# Patient Record
Sex: Female | Born: 1990 | Hispanic: Yes
Health system: Midwestern US, Community
[De-identification: ages and names within clinical notes are randomized; demographics above are authoritative.]

## PROBLEM LIST (undated history)

## (undated) ENCOUNTER — Inpatient Hospital Stay (HOSPITAL_COMMUNITY): Payer: Self-pay

## (undated) DIAGNOSIS — E559 Vitamin D deficiency, unspecified: Secondary | ICD-10-CM

## (undated) DIAGNOSIS — I10 Essential (primary) hypertension: Secondary | ICD-10-CM

---

## 2017-06-28 ENCOUNTER — Encounter (HOSPITAL_COMMUNITY): Payer: Self-pay | Admitting: *Deleted

## 2017-06-28 ENCOUNTER — Inpatient Hospital Stay (HOSPITAL_COMMUNITY)
Admission: AD | Admit: 2017-06-28 | Discharge: 2017-06-28 | Disposition: A | Discharge status: Home / Self Care | Payer: Self-pay | Source: Ambulatory Visit | Attending: Obstetrics & Gynecology | Admitting: Obstetrics & Gynecology

## 2017-06-28 ENCOUNTER — Other Ambulatory Visit: Payer: Self-pay

## 2017-06-28 DIAGNOSIS — O26892 Other specified pregnancy related conditions, second trimester: Secondary | ICD-10-CM

## 2017-06-28 DIAGNOSIS — O0932 Supervision of pregnancy with insufficient antenatal care, second trimester: Secondary | ICD-10-CM

## 2017-06-28 DIAGNOSIS — Z3A24 24 weeks gestation of pregnancy: Secondary | ICD-10-CM

## 2017-06-28 DIAGNOSIS — R102 Pelvic and perineal pain: Secondary | ICD-10-CM | POA: Insufficient documentation

## 2017-06-28 DIAGNOSIS — O0990 Supervision of high risk pregnancy, unspecified, unspecified trimester: Secondary | ICD-10-CM

## 2017-06-28 DIAGNOSIS — O34219 Maternal care for unspecified type scar from previous cesarean delivery: Secondary | ICD-10-CM

## 2017-06-28 DIAGNOSIS — O09292 Supervision of pregnancy with other poor reproductive or obstetric history, second trimester: Secondary | ICD-10-CM

## 2017-06-28 DIAGNOSIS — Z363 Encounter for antenatal screening for malformations: Secondary | ICD-10-CM

## 2017-06-28 LAB — URINALYSIS, ROUTINE W REFLEX MICROSCOPIC
BILIRUBIN URINE: NEGATIVE
Glucose, UA: NEGATIVE mg/dL
HGB URINE DIPSTICK: NEGATIVE
KETONES UR: NEGATIVE mg/dL
Leukocytes, UA: NEGATIVE
Nitrite: NEGATIVE
PROTEIN: NEGATIVE mg/dL
Specific Gravity, Urine: 1.005 (ref 1.005–1.030)
pH: 7 (ref 5.0–8.0)

## 2017-06-28 LAB — CBC
HEMATOCRIT: 29 % — AB (ref 36.0–46.0)
Hemoglobin: 10.1 g/dL — ABNORMAL LOW (ref 12.0–15.0)
MCH: 31.2 pg (ref 26.0–34.0)
MCHC: 34.8 g/dL (ref 30.0–36.0)
MCV: 89.5 fL (ref 78.0–100.0)
PLATELETS: 260 10*3/uL (ref 150–400)
RBC: 3.24 MIL/uL — AB (ref 3.87–5.11)
RDW: 13 % (ref 11.5–15.5)
WBC: 8.6 10*3/uL (ref 4.0–10.5)

## 2017-06-28 LAB — DIFFERENTIAL
BASOS ABS: 0 10*3/uL (ref 0.0–0.1)
BASOS PCT: 0 %
Eosinophils Absolute: 0.1 10*3/uL (ref 0.0–0.7)
Eosinophils Relative: 1 %
Lymphocytes Relative: 19 %
Lymphs Abs: 1.6 10*3/uL (ref 0.7–4.0)
Monocytes Absolute: 0.4 10*3/uL (ref 0.1–1.0)
Monocytes Relative: 5 %
NEUTROS ABS: 6.4 10*3/uL (ref 1.7–7.7)
Neutrophils Relative %: 75 %

## 2017-06-28 LAB — TYPE AND SCREEN
ABO/RH(D): O POS
Antibody Screen: NEGATIVE

## 2017-06-28 LAB — WET PREP, GENITAL
CLUE CELLS WET PREP: NONE SEEN
Sperm: NONE SEEN
Trich, Wet Prep: NONE SEEN
Yeast Wet Prep HPF POC: NONE SEEN

## 2017-06-28 LAB — ABO/RH: ABO/RH(D): O POS

## 2017-06-28 LAB — HEPATITIS B SURFACE ANTIGEN: Hepatitis B Surface Ag: NEGATIVE

## 2017-06-28 NOTE — MAU Note (Signed)
Urine sent to lab 

## 2017-06-28 NOTE — MAU Provider Note (Signed)
CC:  Chief Complaint  Patient presents with  . no prenatal care     First Provider Initiated Contact with Patient 06/28/17 1722      HPI: Abigail Salazar is a 27 y.o. year old 952P1001 female at 8752w0d weeks gestation by LMP who presents to MAU reporting pelvic pressure. [place]oved [place]. States she a an early [place] in [place] because she conceived on birth control and they told her that she had an increased risk of preterm birth has a result. She thinks her EDD by US was around September 5th or 10th. Doesn't remember date or gestational age at US. Doesn't have records or access to records.    Plans to stay in [place] for delivery, but [facility]  Associated Sx:  Vaginal bleeding: Denies Leaking of fluid: Denies Fetal movement: Nml  O:  Patient Vitals for the past 24 hrs:  BP Temp src Pulse Resp SpO2 Weight  06/28/17 1558 (!) 108/59 Oral 86 17 100 % 132 lb 12.8 oz (60.2 kg)    General: NAD Heart: Regular rate Lungs: Normal rate and effort Abd: Soft, NT, Gravid, 27 cm fundal ht.  Pelvic: NEFG, Neg pooling, Neg  blood.  Dilation: Closed Exam by:: Dorathy KinsmanVirginia Reet Scharrer, CNM  EFM: 145, Moderate variability, 15 x 15 accelerations, no decelerations Toco: None  Orders Placed This Encounter  Procedures  . Wet prep, genital  . US MFM OB DETAIL +14 WK  . Hepatitis B surface antigen  . CBC  . Differential  . Urinalysis, Routine w reflex microscopic  . Type and [facility]  . ABO/Rh  . Discharge patient     A: 552w0d week IUP Pelvic pressure liekly Nml for GA. No evidence of preterm labor or other emergent condition.  FHR reactive 1. Pelvic pain affecting pregnancy in second trimester, antepartum   2. Limited prenatal care in second trimester  3. [redacted] weeks gestation of pregnancy   4. History of oligohydramnios in prior pregnancy, currently pregnant in second trimester   5. History of cesarean delivery, currently pregnant   6.  Encounter for antenatal screening for malformation using ultrasound      P: Discharge home in stable condition. Preterm labor precautions and fetal kick counts. Message sent to [facility] to scheduled NOB appt ASAP Anatomy US ordered OP OB Panel pending Return to maternity admissions as needed if symptoms worsen.  Katrinka BlazingSmith, [place], CNM 06/28/2017 5:22 PM  3

## 2017-06-28 NOTE — MAU Note (Signed)
Because of pregnancy, 2 wks ago started having pain and pushing down.  Happens once a wk. No pain today, just feels heavy. No bleeding or leaking, +fm. No care.  [place]rived in [place] 5[place]13, from [place], no care there either.  2nd preg- c/s ? Oligo  baby in NICU for 5 days

## 2017-06-28 NOTE — Discharge Instructions (Signed)
Dolor abdominal en el embarazo °(Abdominal Pain During Pregnancy) °El dolor abdominal es frecuente durante el embarazo. Generalmente no causa ningún daño. El dolor abdominal puede tener numerosas causas. Algunas causas son más graves que otras. Ciertas causas de dolor abdominal durante el embarazo se diagnostican fácilmente. A veces, se tarda un tiempo para llegar al diagnóstico. Otras veces la causa no se conoce. El dolor abdominal puede estar relacionado con alguna alteración del embarazo, o puede deberse a una causa totalmente diferente. Por este motivo, siempre consulte a su médico cuando sienta molestias abdominales. °INSTRUCCIONES PARA EL CUIDADO EN EL HOGAR  °Esté atenta al dolor para ver si hay cambios. Las siguientes indicaciones ayudarán a aliviar cualquier molestia que pueda sentir: °· No tenga relaciones sexuales y no coloque nada dentro de la vagina hasta que los síntomas hayan desaparecido completamente. °· Descanse todo lo que pueda hasta que el dolor se le haya calmado. °· Si siente náuseas, beba líquidos claros. Evite los alimentos sólidos mientras sienta malestar o tenga náuseas. °· Tome sólo medicamentos de venta libre o recetados, según las indicaciones del médico. °· Cumpla con todas las visitas de control, según le indique su médico. °SOLICITE ATENCIÓN MÉDICA DE INMEDIATO SI: °· Tiene un sangrado, pérdida de líquidos o elimina tejidos por la vagina. °· El dolor o los cólicos aumentan. °· Tiene vómitos persistentes. °· Comienza a sentir dolor al orinar u observa sangre. °· Tiene fiebre. °· Nota que los movimientos del bebé disminuyen. °· Siente intensa debilidad o se marea. °· Tiene dificultad para respirar con o sin dolor abdominal. °· Siente un dolor de cabeza intenso junto al dolor abdominal. °· Tiene una secreción vaginal anormal con dolor abdominal. °· Tiene diarrea persistente. °· El dolor abdominal sigue o empeora aún después de hacer reposo. °ASEGÚRESE DE QUE:  °· Comprende estas  instrucciones. °· Controlará su afección. °· Recibirá ayuda de inmediato si no mejora o si empeora. °Esta información no tiene como fin reemplazar el consejo del médico. Asegúrese de hacerle al médico cualquier pregunta que tenga. °Document Released: 01/11/2005 Document Revised: 05/05/2015 °Elsevier Interactive Patient Education © 2017 Elsevier Inc. ° °

## 2017-06-29 LAB — GC/CHLAMYDIA PROBE AMP (~~LOC~~) NOT AT ARMC
Chlamydia: NEGATIVE
NEISSERIA GONORRHEA: NEGATIVE

## 2017-06-29 LAB — HIV ANTIBODY (ROUTINE TESTING W REFLEX): HIV Screen 4th Generation wRfx: NONREACTIVE

## 2017-06-29 LAB — RPR: RPR: NONREACTIVE

## 2017-06-29 LAB — RUBELLA SCREEN: RUBELLA: 2.14 {index} (ref >0.99)

## 2017-06-30 DIAGNOSIS — O0932 Supervision of pregnancy with insufficient antenatal care, second trimester: Secondary | ICD-10-CM

## 2017-06-30 DIAGNOSIS — O34219 Maternal care for unspecified type scar from previous cesarean delivery: Secondary | ICD-10-CM | POA: Diagnosis present

## 2017-06-30 DIAGNOSIS — O0990 Supervision of high risk pregnancy, unspecified, unspecified trimester: Secondary | ICD-10-CM

## 2017-06-30 DIAGNOSIS — O09292 Supervision of pregnancy with other poor reproductive or obstetric history, second trimester: Secondary | ICD-10-CM

## 2017-07-05 ENCOUNTER — Encounter (HOSPITAL_COMMUNITY): Payer: Self-pay

## 2017-07-13 ENCOUNTER — Encounter (HOSPITAL_COMMUNITY): Payer: Self-pay

## 2017-07-13 ENCOUNTER — Ambulatory Visit (HOSPITAL_COMMUNITY)
Admission: RE | Admit: 2017-07-13 | Discharge: 2017-07-13 | Disposition: A | Discharge status: Home / Self Care | Payer: Self-pay | Source: Ambulatory Visit | Attending: Advanced Practice Midwife | Admitting: Advanced Practice Midwife

## 2017-07-13 ENCOUNTER — Other Ambulatory Visit (HOSPITAL_COMMUNITY): Payer: Self-pay | Admitting: Advanced Practice Midwife

## 2017-07-13 DIAGNOSIS — O26892 Other specified pregnancy related conditions, second trimester: Secondary | ICD-10-CM | POA: Insufficient documentation

## 2017-07-13 DIAGNOSIS — R102 Pelvic and perineal pain: Secondary | ICD-10-CM | POA: Insufficient documentation

## 2017-07-13 DIAGNOSIS — O0932 Supervision of pregnancy with insufficient antenatal care, second trimester: Secondary | ICD-10-CM | POA: Insufficient documentation

## 2017-07-13 DIAGNOSIS — Z363 Encounter for antenatal screening for malformations: Secondary | ICD-10-CM

## 2017-07-13 DIAGNOSIS — Z3A26 26 weeks gestation of pregnancy: Secondary | ICD-10-CM | POA: Insufficient documentation

## 2017-07-13 DIAGNOSIS — O09292 Supervision of pregnancy with other poor reproductive or obstetric history, second trimester: Secondary | ICD-10-CM | POA: Insufficient documentation

## 2017-07-13 DIAGNOSIS — O34219 Maternal care for unspecified type scar from previous cesarean delivery: Secondary | ICD-10-CM

## 2017-07-13 DIAGNOSIS — O0990 Supervision of high risk pregnancy, unspecified, unspecified trimester: Secondary | ICD-10-CM

## 2017-07-13 DIAGNOSIS — Z3A24 24 weeks gestation of pregnancy: Secondary | ICD-10-CM

## 2017-07-13 NOTE — ED Notes (Signed)
Eda Royal present as interpreter. 

## 2017-07-20 ENCOUNTER — Telehealth: Payer: Self-pay | Admitting: General Practice

## 2017-07-20 NOTE — Telephone Encounter (Signed)
Unable to reach patient via phone. Left message on VM for patient to give our office a call in regards to appointment on 07/29/17 at 1:35pm.  Appointment reminder mailed to patient.

## 2017-07-29 ENCOUNTER — Encounter: Payer: Self-pay | Admitting: General Practice

## 2017-07-29 ENCOUNTER — Ambulatory Visit (INDEPENDENT_AMBULATORY_CARE_PROVIDER_SITE_OTHER): Payer: Self-pay | Admitting: Obstetrics and Gynecology

## 2017-07-29 ENCOUNTER — Encounter: Payer: Self-pay | Admitting: Obstetrics and Gynecology

## 2017-07-29 VITALS — BP 99/57 | HR 81 | Wt 137.7 lb

## 2017-07-29 DIAGNOSIS — O0993 Supervision of high risk pregnancy, unspecified, third trimester: Secondary | ICD-10-CM

## 2017-07-29 DIAGNOSIS — Z124 Encounter for screening for malignant neoplasm of cervix: Secondary | ICD-10-CM

## 2017-07-29 DIAGNOSIS — Z98891 History of uterine scar from previous surgery: Secondary | ICD-10-CM

## 2017-07-29 DIAGNOSIS — O34212 Maternal care for vertical scar from previous cesarean delivery: Secondary | ICD-10-CM

## 2017-07-29 DIAGNOSIS — O0990 Supervision of high risk pregnancy, unspecified, unspecified trimester: Secondary | ICD-10-CM

## 2017-07-29 DIAGNOSIS — Z23 Encounter for immunization: Secondary | ICD-10-CM

## 2017-07-29 DIAGNOSIS — Z113 Encounter for screening for infections with a predominantly sexual mode of transmission: Secondary | ICD-10-CM

## 2017-07-29 LAB — POCT URINALYSIS DIP (DEVICE)
BILIRUBIN URINE: NEGATIVE
Glucose, UA: NEGATIVE mg/dL
HGB URINE DIPSTICK: NEGATIVE
KETONES UR: NEGATIVE mg/dL
Leukocytes, UA: NEGATIVE
NITRITE: NEGATIVE
PH: 7 (ref 5.0–8.0)
Protein, ur: NEGATIVE mg/dL
SPECIFIC GRAVITY, URINE: 1.015 (ref 1.005–1.030)
Urobilinogen, UA: 0.2 mg/dL (ref 0.0–1.0)

## 2017-07-29 NOTE — Progress Notes (Signed)
Video Interpreter # [id number] New ob/28 wk packet given

## 2017-07-29 NOTE — Progress Notes (Signed)
Patient do not have ID nor passport.  Stated they are working on getting that information.

## 2017-07-29 NOTE — Progress Notes (Signed)
Subjective:   Abigail Salazar is a 27 y.o. G2P1001 at 5783w3d by LMP being seen today for her first obstetrical visit.  Her obstetrical history is significant for Late to care, previous [place], [facility], vertical incision.  Patient does intend to breast feed. Pregnancy history fully reviewed. Happy about pregnancy.   Patient reports no complaints.  HISTORY: OB History  Gravida Para Term Preterm AB Living  2 1 1  0 0 1  SAB TAB Ectopic Multiple Live Births  0 0 0 0 1    # Outcome Date GA Lbr Len/2nd Weight Sex Delivery Anes PTL Lv  2 Current           1 Term 09/30/13 4380w0d  7 lb 5 oz (3.317 kg) F CS-Unspec Spinal N LIV     Complications: Oligohydramnios    Last pap smear was done 07/29/2017  Past Medical History:  Diagnosis Date  . Medical history non-contributory    Past Surgical History:  Procedure Laterality Date  . CESAREAN SECTION     Family History  Problem Relation Age of Onset  . Arthritis Father    Social History   Tobacco Use  . Smoking status: Never Smoker  . Smokeless tobacco: Never Used  Substance Use Topics  . Alcohol use: Not Currently  . Drug use: Not Currently   No Known Allergies Current Outpatient Medications on File Prior to Visit  Medication Sig Dispense Refill  . Prenatal Vit-Fe Fumarate-FA (PRENATAL MULTIVITAMIN) TABS tablet Take 1 tablet by mouth daily at 12 noon.     No current facility-administered medications on file prior to visit.     Review of Systems Pertinent items noted in HPI and remainder of comprehensive ROS otherwise negative.  Exam   Vitals:   07/29/17 1403  BP: (!) 99/57  Pulse: 81  Weight: 137 lb 11.2 oz (62.5 kg)   Fetal Heart Rate (bpm): 150  BP (!) 99/57   Pulse 81   Wt 137 lb 11.2 oz (62.5 kg)   LMP 01/11/2017 (Exact Date)   BMI 26.89 kg/m    Uterine Size: size equals dates  Pelvic Exam:    Perineum: No Hemorrhoids, Normal Perineum   Vulva: normal     Vagina:  normal mucosa, normal discharge, no palpable nodules   pH: Not done   Cervix: no bleeding following Pap, no cervical motion tenderness and no lesions   Adnexa: normal adnexa and no mass, fullness, tenderness   Bony Pelvis: Adequate  System: Breast:  No nipple retraction or dimpling, No nipple discharge or bleeding, No axillary or supraclavicular adenopathy, Normal to palpation without dominant masses   Skin: normal coloration and turgor, no rashes    Neurologic: negative   Extremities: normal strength, tone, and muscle mass   HEENT neck supple with midline trachea and thyroid without masses   Mouth/Teeth mucous membranes moist, pharynx normal without lesions   Neck supple and no masses   Cardiovascular: regular rate and rhythm, no murmurs or gallops   Respiratory:  appears well, vitals normal, no respiratory distress, acyanotic, normal RR, neck free of mass or lymphadenopathy, chest clear, no wheezing, crepitations, rhonchi, normal symmetric air entry   Abdomen: soft, non-tender; bowel sounds normal; no masses,  no organomegaly, vertical incision noted.   Urinary: urethral meatus normal     Assessment:   Pregnancy: G2P1001 Patient Active Problem List   Diagnosis Date Noted  . History of classical cesarean section 07/29/2017  .  Supervision of high risk pregnancy, antepartum 06/30/2017  . Limited prenatal care in second trimester 06/30/2017  . History of oligohydramnios in prior pregnancy, currently pregnant in second trimester 06/30/2017  . History of cesarean delivery, currently pregnant 06/30/2017     Plan:   1. Supervision of high risk pregnancy, antepartum - Cystic fibrosis gene test - Hemoglobinopathy Evaluation - SMN1 COPY NUMBER ANALYSIS (SMA Carrier Screen) - Tdap vaccine greater than or equal to 7yo IM - Cytology - PAP - 2 hour GTT at next visit; come fasting   2. History of classical cesarean section  Discussed with Dr. Earlene Plater, Dr. Earlene Plater will speak to  patient more about this at her next visit.   Other orders - POCT urinalysis dip (device)  Initial labs drawn. Continue prenatal vitamins. Genetic Screening discussed, Declined : results reviewed. Ultrasound discussed; fetal anatomic survey: results reviewed. Problem list reviewed and updated. The nature of [facility] with multiple MDs and other Advanced Practice Providers was explained to patient; also emphasized that residents, students are part of our team. Routine obstetric precautions reviewed. Return in about 2 weeks (around 08/12/2017) for Come fasting for 2 hour GTT .    Rasch, Harolyn Rutherford, NP Center for [employer], [facility]

## 2017-08-01 LAB — CYTOLOGY - PAP
Chlamydia: NEGATIVE
Diagnosis: NEGATIVE
Neisseria Gonorrhea: NEGATIVE

## 2017-08-05 ENCOUNTER — Other Ambulatory Visit: Payer: Self-pay | Admitting: General Practice

## 2017-08-05 DIAGNOSIS — O0990 Supervision of high risk pregnancy, unspecified, unspecified trimester: Secondary | ICD-10-CM

## 2017-08-05 LAB — CYSTIC FIBROSIS GENE TEST

## 2017-08-05 LAB — HEMOGLOBINOPATHY EVALUATION
Ferritin: 19 ng/mL (ref 15–150)
HEMATOCRIT: 33.8 % — AB (ref 34.0–46.6)
HEMOGLOBIN: 11.4 g/dL (ref 11.1–15.9)
HGB A: 98 % (ref 96.4–98.8)
HGB C: 0 %
HGB F QUANT: 0 % (ref 0.0–2.0)
HGB VARIANT: 0 %
Hgb A2 Quant: 2 % (ref 1.8–3.2)
Hgb S: 0 %
Hgb Solubility: NEGATIVE
MCH: 31 pg (ref 26.6–33.0)
MCHC: 33.7 g/dL (ref 31.5–35.7)
MCV: 92 fL (ref 79–97)
Platelets: 219 10*3/uL (ref 150–450)
RBC: 3.68 x10E6/uL — ABNORMAL LOW (ref 3.77–5.28)
RDW: 12.8 % (ref 12.3–15.4)
WBC: 8.3 10*3/uL (ref 3.4–10.8)

## 2017-08-05 LAB — SMN1 COPY NUMBER ANALYSIS (SMA CARRIER SCREENING)

## 2017-08-08 ENCOUNTER — Other Ambulatory Visit: Payer: Self-pay

## 2017-08-08 DIAGNOSIS — O0990 Supervision of high risk pregnancy, unspecified, unspecified trimester: Secondary | ICD-10-CM

## 2017-08-09 LAB — GLUCOSE TOLERANCE, 2 HOURS W/ 1HR
Glucose, 1 hour: 103 mg/dL (ref 65–179)
Glucose, 2 hour: 87 mg/dL (ref 65–152)
Glucose, Fasting: 71 mg/dL (ref 65–91)

## 2017-08-12 ENCOUNTER — Encounter: Payer: Self-pay | Admitting: Obstetrics and Gynecology

## 2017-08-12 ENCOUNTER — Ambulatory Visit (INDEPENDENT_AMBULATORY_CARE_PROVIDER_SITE_OTHER): Payer: Self-pay | Admitting: Obstetrics and Gynecology

## 2017-08-12 VITALS — BP 100/58 | HR 79 | Wt 138.0 lb

## 2017-08-12 DIAGNOSIS — O0932 Supervision of pregnancy with insufficient antenatal care, second trimester: Secondary | ICD-10-CM

## 2017-08-12 DIAGNOSIS — Z98891 History of uterine scar from previous surgery: Secondary | ICD-10-CM

## 2017-08-12 DIAGNOSIS — Z789 Other specified health status: Secondary | ICD-10-CM

## 2017-08-12 DIAGNOSIS — Z603 Acculturation difficulty: Secondary | ICD-10-CM | POA: Insufficient documentation

## 2017-08-12 DIAGNOSIS — O09292 Supervision of pregnancy with other poor reproductive or obstetric history, second trimester: Secondary | ICD-10-CM

## 2017-08-12 DIAGNOSIS — O0990 Supervision of high risk pregnancy, unspecified, unspecified trimester: Secondary | ICD-10-CM

## 2017-08-12 NOTE — Patient Instructions (Signed)
AREA PEDIATRIC/FAMILY PRACTICE PHYSICIANS  [facility] [address], [address] [place], [place]  [address] Phone - [phone]   Fax - [phone]  [facility] [address] [place], [place] [address] Phone - [phone]   Fax - [phone]  [facility] [address] [place], [place]  [address] Phone - [phone]   Fax - [phone]  [facility] [address] [place], [place]  [address] Phone - [phone]   Fax - [phone]  [facility] [address] [place], [place]  [address] Phone - [phone]   Fax - [phone]  [facility] [address] [place], [place]  [address] Phone - [phone]   Fax - [phone]  [facility] [address] [place], [place]  [address] Phone - [phone]   Fax - [phone]  [facility] [address] [place], [place]  [address] Phone - [phone]   Fax - [phone]  [facility] [address] [place], [place]  [address] Phone - [phone]   Fax - [phone] [facility] [address] [place], [place]  [address] Phone - [phone]   Fax - [phone]  [facility] [address] [place], [place]  [address] Phone - [phone]   Fax - [phone]  [facility] [address] [place], [place]  [address] Phone - [phone]   Fax - [phone]  [facility] [address] [place], [place]  [address] Phone - [phone]   Fax - [phone]  [facility] [address] [place], [place]  [address] Phone - [phone]  [facility] [address] [place], [place]  [address] Phone - [phone]   Fax - [phone]  [facility] [address] [place], [place]  [address] Phone - [phone]   Fax - [phone]  [facility] [address] [place], [place]  [address] Phone - [phone]   Fax - [phone]  [facility] [address] [place], [place]  [address] Phone - [phone]   Fax - [phone]   [facility] [address] [place], [place]  [address] Phone - [phone]   Fax - [phone]  [facility] [address] [place], [place]  [address] Phone - [phone]   Fax - [phone]  [facility] [address] [place], [place]  [address] Phone - [phone]   Fax - [phone]  [facility] [address] [place], [place]  [address] Phone - [phone]   Fax - [phone]  [facility] [address] , [place]  [address] Phone - [phone]   Fax - [phone] [facility] [address] [place], [place]  [address] Phone - [phone]   Fax - [phone]  [facility] [address] [place],   [address] Phone - [phone]   Fax - [phone]  [facility] [address] [place],   [address] Phone - [phone]   Fax - [phone]  [facility] Charlene Flemming MD [address] [place]  [address] Phone [phone]  Fax [phone]   

## 2017-08-12 NOTE — Progress Notes (Signed)
   PRENATAL VISIT NOTE  Subjective:  Abigail Salazar is a 27 y.o. G2P1001 at 7177w3d being seen today for ongoing prenatal care.  She is currently monitored for the following issues for this low-risk pregnancy and has Supervision of high risk pregnancy, antepartum; Limited prenatal care in second trimester; History of oligohydramnios in prior pregnancy, currently pregnant in second trimester; History of cesarean delivery, currently pregnant; History of classical cesarean section; and Language barrier on their problem list.  Patient reports no complaints.  Contractions: Not present. Vag. Bleeding: None.  Movement: Present. Denies leaking of fluid.   The following portions of the patient's history were reviewed and updated as appropriate: allergies, current medications, past family history, past medical history, past social history, past surgical history and problem list. Problem list updated.  Objective:   Vitals:   08/12/17 0952  BP: (!) 100/58  Pulse: 79  Weight: 138 lb (62.6 kg)    Fetal Status: Fetal Heart Rate (bpm): 144   Movement: Present     General:  Alert, oriented and cooperative. Patient is in no acute distress.  Skin: Skin is warm and dry. No rash noted.   Cardiovascular: Normal heart rate noted  Respiratory: Normal respiratory effort, no problems with respiration noted  Abdomen: Soft, gravid, appropriate for gestational age.  Pain/Pressure: Present     Pelvic: Cervical exam deferred        Extremities: Normal range of motion.  Edema: None  Mental Status: Normal mood and affect. Normal behavior. Normal judgment and thought content.   Assessment and Plan:  Pregnancy: G2P1001 at 8377w3d  1. Supervision of high risk pregnancy, antepartum  2. Limited prenatal care in second trimester  3. History of oligohydramnios in prior pregnancy, currently pregnant in second trimester  4. History of vertical skin incision in [place] Patient reports emergency c-section done at  40 weeks for no fluid in baby. Did not labor. Was not told she should have a repeat c-section, reports baby was in correct position (head down). I reviewed the possibility of classical vs low transverse hysterotomy with her and the risks of laboring with an unknown scar (she has a vertical skin incision). As she had a c-section at 40 weeks while not laboring, it would be reasonable to assume she had a low transverse hysterotomy and allow her to labor, but reviewed that we could not know for certain. She verbalizes understanding of the above. Reviewed risks/benefits of TOLAC versus RCS in detail. Patient counseled regarding potential vaginal delivery, chance of success, future implications, possible uterine rupture and need for urgent/emergent repeat cesarean. Counseled regarding potential need for repeat c-section for reasons unrelated to first c-section. Counseled regarding scheduled repeat cesarean including risks of bleeding, infection, damage to surrounding tissue, abnormal placentation, implications for future pregnancies. All questions answered.  Patient leaning towards repeat c-section, will consider.  5. Language barrier Spanish interpretor used   Preterm labor symptoms and general obstetric precautions including but not limited to vaginal bleeding, contractions, leaking of fluid and fetal movement were reviewed in detail with the patient. Please refer to After Visit Summary for other counseling recommendations.  Return in about 2 weeks (around 08/26/2017) for OB visit.  Future Appointments  Date Time Provider Department Center  08/26/2017 11:15 AM Constant, Gigi GinPeggy, MD [facility]    Conan BowensKelly M Faraaz Wolin, MD

## 2017-08-13 ENCOUNTER — Encounter (HOSPITAL_COMMUNITY): Payer: Self-pay

## 2017-08-13 ENCOUNTER — Other Ambulatory Visit: Payer: Self-pay

## 2017-08-13 ENCOUNTER — Inpatient Hospital Stay (HOSPITAL_COMMUNITY)
Admission: AD | Admit: 2017-08-13 | Discharge: 2017-08-13 | Disposition: A | Discharge status: Home / Self Care | Payer: Self-pay | Source: Ambulatory Visit | Attending: Obstetrics and Gynecology | Admitting: Obstetrics and Gynecology

## 2017-08-13 DIAGNOSIS — Z3A30 30 weeks gestation of pregnancy: Secondary | ICD-10-CM | POA: Insufficient documentation

## 2017-08-13 DIAGNOSIS — Z3A35 35 weeks gestation of pregnancy: Secondary | ICD-10-CM

## 2017-08-13 DIAGNOSIS — O26893 Other specified pregnancy related conditions, third trimester: Secondary | ICD-10-CM | POA: Insufficient documentation

## 2017-08-13 DIAGNOSIS — R1030 Lower abdominal pain, unspecified: Secondary | ICD-10-CM | POA: Insufficient documentation

## 2017-08-13 DIAGNOSIS — O4703 False labor before 37 completed weeks of gestation, third trimester: Secondary | ICD-10-CM

## 2017-08-13 DIAGNOSIS — R109 Unspecified abdominal pain: Secondary | ICD-10-CM

## 2017-08-13 HISTORY — DX: Essential (primary) hypertension: I10

## 2017-08-13 LAB — URINALYSIS, ROUTINE W REFLEX MICROSCOPIC
BILIRUBIN URINE: NEGATIVE
Glucose, UA: NEGATIVE mg/dL
HGB URINE DIPSTICK: NEGATIVE
KETONES UR: NEGATIVE mg/dL
Leukocytes, UA: NEGATIVE
Nitrite: NEGATIVE
PROTEIN: NEGATIVE mg/dL
Specific Gravity, Urine: 1.004 — ABNORMAL LOW (ref 1.005–1.030)
pH: 7 (ref 5.0–8.0)

## 2017-08-13 MED ORDER — TERBUTALINE SULFATE 1 MG/ML IJ SOLN
0.2500 mg | Freq: Once | INTRAMUSCULAR | Status: AC
  Administered 2017-08-13: 0.25 mg via SUBCUTANEOUS
  Filled 2017-08-13: qty 1

## 2017-08-13 NOTE — Discharge Instructions (Signed)
Dolor abdominal durante el embarazo (Abdominal Pain During Pregnancy) El dolor de vientre (abdominal) es habitual durante el embarazo. Generalmente no se trata de un problema grave. Otras veces puede ser un signo de que algo no anda bien. Siempre comunquese con su mdico si tiene dolor abdominal. CUIDADOS EN EL HOGAR Controle el dolor para ver si hay cambios. Las indicaciones que siguen pueden ayudarla a sentirse mejor:  Notenga sexo (relaciones sexuales) ni se coloque nada dentro de la vagina hasta que se sienta mejor.  Haga reposo hasta que el dolor se calme.  Si siente ganas de vomitar (nuseas ) beba lquidos claros. No consuma alimentos slidos hasta que se sienta mejor.  Slo tome los medicamentos que le haya indicado su mdico.  Cumpla con las visitas al mdico segn las indicaciones. SOLICITE AYUDA DE INMEDIATO SI:  Tiene un sangrado, pierde lquido o elimina trozos de tejido por la vagina.  Siente ms dolor o clicos.  Comienza a vomitar.  Siente dolor al orinar u observa sangre en la orina.  Tiene fiebre.  No siente que el beb se mueva mucho.  Se siente muy dbil o cree que va a desmayarse.  Tiene dificultad para respirar con o sin dolor en el vientre.  Siente un dolor de cabeza muy intenso y dolor en el vientre.  Observa que sale un lquido por la vagina y tiene dolor abdominal.  La materia fecal es lquida (diarrea).  El dolor en el viente no desaparece, o empeora, luego de hacer reposo. ASEGRESE DE QUE:  Comprende estas instrucciones.  Controlar su afeccin.  Recibir ayuda de inmediato si no mejora o si empeora. Esta informacin no tiene como fin reemplazar el consejo del mdico. Asegrese de hacerle al mdico cualquier pregunta que tenga. Document Released: 09/23/2010 Document Revised: 05/05/2015 Document Reviewed: 08/10/2012 Elsevier Interactive Patient Education  2018 Elsevier Inc.  

## 2017-08-13 NOTE — Progress Notes (Addendum)
G2P1 with previous c/s. Noted classical incision. Pt is unsure of internal incision.   Presents to triage for ctx. States no ctx now. Denies LOF or bleeding. +FM  EFM applied  1957: provider at bs  SVE closed   2020: big pitcher of water provided for pt to drink per provider.   2037: checked on pt. Pt cont to drink water and only has 1/4 cup left of the pitcher of water.   Urine results back. Provider made aware.   2046: ordered for terb noted.  2049 pending med verification by pharmacy.   2056: terb administered.   2100: tranducer adjusted. Turned pt to left side  2125: SVE closed

## 2017-08-13 NOTE — MAU Note (Signed)
Urine in lab 

## 2017-08-13 NOTE — MAU Provider Note (Addendum)
History   G2 P1-0-0-1 at 30 weeks and 4 days in with lower abdominal pain since this morning.  Patient denies vaginal bleeding or rupture membranes.  Patient did have classical cesarean section with prior pregnancy for unknown reason.  Patient admits to poor fluid intake today.  CSN: [id number]  Arrival date & time 08/13/17  1938   None     Chief Complaint  Patient presents with  . Contractions    HPI  Past Medical History:  Diagnosis Date  . Medical history non-contributory     Past Surgical History:  Procedure Laterality Date  . CESAREAN SECTION      Family History  Problem Relation Age of Onset  . Arthritis Father     Social History   Tobacco Use  . Smoking status: Never Smoker  . Smokeless tobacco: Never Used  Substance Use Topics  . Alcohol use: Not Currently  . Drug use: Not Currently    OB History    Gravida  2   Para  1   Term  1   Preterm      AB      Living  1     SAB      TAB      Ectopic      Multiple      Live Births  1           Review of Systems  Constitutional: Negative.   HENT: Negative.   Eyes: Negative.   Respiratory: Negative.   Cardiovascular: Negative.   Gastrointestinal: Positive for abdominal pain.  Endocrine: Negative.   Genitourinary: Negative.   Musculoskeletal: Negative.   Skin: Negative.   Allergic/Immunologic: Negative.   Neurological: Negative.   Hematological: Negative.   Psychiatric/Behavioral: Negative.     Allergies  Patient has no known allergies.  Home Medications    LMP 01/11/2017 (Exact Date)   Physical Exam  Constitutional: She is oriented to person, place, and time. She appears well-developed and well-nourished.  HENT:  Head: Normocephalic.  Neck: Normal range of motion.  Cardiovascular: Normal rate, regular rhythm, normal heart sounds and intact distal pulses.  Pulmonary/Chest: Effort normal and breath sounds normal.  Abdominal: Soft. Bowel sounds are normal.  Genitourinary:  Vagina normal and uterus normal.  Musculoskeletal: Normal range of motion.  Neurological: She is alert and oriented to person, place, and time.  Skin: Skin is warm and dry.  Psychiatric: She has a normal mood and affect. Her behavior is normal. Judgment and thought content normal.    MAU Course  Procedures (including critical care time)  Labs Reviewed - No data to display No results found.     Patient CARE turned over to Jearld AdjutantErin Digna Countess nurse practitioner.  MDM  Vital signs are stable fetal heart rate is 145-150 with accelerations no decelerations moderate variability.  Mild uc's noted q 2-3 min apart. Will hydrate pt.  Sterile vaginal exam was done cervix is closed posterior thick and high.  Contractions have spaced out with oral hydration, we will give 1 dose of subcu terbutaline and attempt to arrest uterine contractions.   Ctx resolved with terbutaline. Patients denies pain. Cervix remains closed.   A: 1. Abdominal pain in pregnancy, third trimester   2. Preterm uterine contractions in third trimester, antepartum   3. [redacted] weeks gestation of pregnancy    P: Discharge home PTL precautions F/u with OB  Judeth HornLawrence, Shawntae Lowy, NP

## 2017-08-26 ENCOUNTER — Ambulatory Visit (INDEPENDENT_AMBULATORY_CARE_PROVIDER_SITE_OTHER): Payer: Self-pay | Admitting: Obstetrics and Gynecology

## 2017-08-26 ENCOUNTER — Encounter: Payer: Self-pay | Admitting: Obstetrics and Gynecology

## 2017-08-26 VITALS — BP 96/62 | HR 75 | Wt 139.0 lb

## 2017-08-26 DIAGNOSIS — O0932 Supervision of pregnancy with insufficient antenatal care, second trimester: Secondary | ICD-10-CM

## 2017-08-26 DIAGNOSIS — O0993 Supervision of high risk pregnancy, unspecified, third trimester: Secondary | ICD-10-CM

## 2017-08-26 DIAGNOSIS — O09292 Supervision of pregnancy with other poor reproductive or obstetric history, second trimester: Secondary | ICD-10-CM

## 2017-08-26 DIAGNOSIS — O0933 Supervision of pregnancy with insufficient antenatal care, third trimester: Secondary | ICD-10-CM

## 2017-08-26 DIAGNOSIS — O0990 Supervision of high risk pregnancy, unspecified, unspecified trimester: Secondary | ICD-10-CM

## 2017-08-26 DIAGNOSIS — Z98891 History of uterine scar from previous surgery: Secondary | ICD-10-CM

## 2017-08-26 DIAGNOSIS — O09293 Supervision of pregnancy with other poor reproductive or obstetric history, third trimester: Secondary | ICD-10-CM

## 2017-08-26 MED ORDER — BUTALBITAL-APAP-CAFFEINE 50-325-40 MG PO CAPS: 1.0000 | ORAL_CAPSULE | Freq: Four times a day (QID) | ORAL | 3 refills | Status: DC | PRN

## 2017-08-26 NOTE — Progress Notes (Signed)
Patient reports headaches since last visit, denies dizziness or blurry vision

## 2017-08-26 NOTE — Progress Notes (Signed)
   PRENATAL VISIT NOTE  Subjective:  Abigail Salazar is a 4[name] y.o. G2P1001 at 348w3d being seen today for ongoing prenatal care.  She is currently monitored for the following issues for this low-risk pregnancy and has Supervision of high risk pregnancy, antepartum; Limited prenatal care in second trimester; History of oligohydramnios in prior pregnancy, currently pregnant in second trimester; History of cesarean delivery, currently pregnant; History of classical cesarean section; and Language barrier on their problem list.  Patient reports headache.  Contractions: Not present. Vag. Bleeding: None.  Movement: Present. Denies leaking of fluid.   The following portions of the patient's history were reviewed and updated as appropriate: allergies, current medications, past family history, past medical history, past social history, past surgical history and problem list. Problem list updated.  Objective:   Vitals:   08/26/17 1121  BP: 96/62  Pulse: 75  Weight: 139 lb (63 kg)    Fetal Status: Fetal Heart Rate (bpm): 143 Fundal Height: 32 cm Movement: Present     General:  Alert, oriented and cooperative. Patient is in no acute distress.  Skin: Skin is warm and dry. No rash noted.   Cardiovascular: Normal heart rate noted  Respiratory: Normal respiratory effort, no problems with respiration noted  Abdomen: Soft, gravid, appropriate for gestational age.  Pain/Pressure: Absent     Pelvic: Cervical exam deferred        Extremities: Normal range of motion.  Edema: None  Mental Status: Normal mood and affect. Normal behavior. Normal judgment and thought content.   Assessment and Plan:  Pregnancy: G2P1001 at 348w3d  1. Supervision of high risk pregnancy, antepartum Patient is doing well Advised patient to stay hydrated Offered Fioricet  2. Limited prenatal care in second trimester   4. History of classical cesarean section Vertical skin incision with delivery at 40 weeks Patient is  interested in repeat cesarean section- will be scheduled at 39 weeks  Preterm labor symptoms and general obstetric precautions including but not limited to vaginal bleeding, contractions, leaking of fluid and fetal movement were reviewed in detail with the patient. Please refer to After Visit Summary for other counseling recommendations.  Return in about 2 weeks (around 09/09/2017) for ROB.  No future appointments.  Catalina AntiguaPeggy Kashena Novitski, MD

## 2017-08-29 ENCOUNTER — Telehealth: Payer: Self-pay | Admitting: *Deleted

## 2017-08-29 ENCOUNTER — Encounter (HOSPITAL_COMMUNITY): Payer: Self-pay

## 2017-08-29 NOTE — Telephone Encounter (Signed)
Found printed prescription on printer instead of eprescribe. Called in rx for Fioricet #30 by Dr. Jolayne Pantheronstant on 08/26/17. Left message if already filled by eprescibe, disregard message for this rx.

## 2017-09-09 ENCOUNTER — Other Ambulatory Visit: Payer: Self-pay | Admitting: Family Medicine

## 2017-09-15 ENCOUNTER — Ambulatory Visit (INDEPENDENT_AMBULATORY_CARE_PROVIDER_SITE_OTHER): Payer: Self-pay | Admitting: Obstetrics and Gynecology

## 2017-09-15 ENCOUNTER — Encounter: Payer: Self-pay | Admitting: Obstetrics and Gynecology

## 2017-09-15 VITALS — BP 96/59 | HR 67 | Wt 140.0 lb

## 2017-09-15 DIAGNOSIS — Z789 Other specified health status: Secondary | ICD-10-CM

## 2017-09-15 DIAGNOSIS — O34219 Maternal care for unspecified type scar from previous cesarean delivery: Secondary | ICD-10-CM

## 2017-09-15 DIAGNOSIS — O0990 Supervision of high risk pregnancy, unspecified, unspecified trimester: Secondary | ICD-10-CM

## 2017-09-15 NOTE — Progress Notes (Signed)
   PRENATAL VISIT NOTE  Subjective:  Abigail Salazar is a 27 y.o. G2P1001 at 78102w2d being seen today for ongoing prenatal care.  She is currently monitored for the following issues for this low-risk pregnancy and has Supervision of high risk pregnancy, antepartum; Limited prenatal care in second trimester; History of oligohydramnios in prior pregnancy, currently pregnant in second trimester; History of cesarean delivery, currently pregnant; and Language barrier on their problem list.  Patient reports no complaints.  Contractions: Irregular. Vag. Bleeding: None.  Movement: Present. Denies leaking of fluid.   The following portions of the patient's history were reviewed and updated as appropriate: allergies, current medications, past family history, past medical history, past social history, past surgical history and problem list. Problem list updated.  Objective:   Vitals:   09/15/17 1102  BP: (!) 96/59  Pulse: 67  Weight: 140 lb (63.5 kg)    Fetal Status: Fetal Heart Rate (bpm): 170 Fundal Height: 35 cm Movement: Present     General:  Alert, oriented and cooperative. Patient is in no acute distress.  Skin: Skin is warm and dry. No rash noted.   Cardiovascular: Normal heart rate noted  Respiratory: Normal respiratory effort, no problems with respiration noted  Abdomen: Soft, gravid, appropriate for gestational age.  Pain/Pressure: Present     Pelvic: Cervical exam deferred        Extremities: Normal range of motion.  Edema: None  Mental Status: Normal mood and affect. Normal behavior. Normal judgment and thought content.   Assessment and Plan:  Pregnancy: G2P1001 at 82102w2d  1. Supervision of high risk pregnancy, antepartum Patient is doing well without complaints Cultures next visit   2. History of cesarean delivery, currently pregnant Scheduled for repeat at 39 weeks  3. Language barrier Spanish interpreter used  Preterm labor symptoms and general obstetric  precautions including but not limited to vaginal bleeding, contractions, leaking of fluid and fetal movement were reviewed in detail with the patient. Please refer to After Visit Summary for other counseling recommendations.  Return in about 1 week (around 09/22/2017) for ROB.  No future appointments.  Catalina AntiguaPeggy Jaimy Kliethermes, MD

## 2017-09-28 ENCOUNTER — Ambulatory Visit (INDEPENDENT_AMBULATORY_CARE_PROVIDER_SITE_OTHER): Payer: Self-pay | Admitting: Obstetrics and Gynecology

## 2017-09-28 ENCOUNTER — Encounter: Payer: Self-pay | Admitting: Obstetrics and Gynecology

## 2017-09-28 VITALS — BP 100/61 | HR 85 | Wt 144.3 lb

## 2017-09-28 DIAGNOSIS — O34219 Maternal care for unspecified type scar from previous cesarean delivery: Secondary | ICD-10-CM

## 2017-09-28 DIAGNOSIS — Z603 Acculturation difficulty: Secondary | ICD-10-CM

## 2017-09-28 DIAGNOSIS — Z789 Other specified health status: Secondary | ICD-10-CM

## 2017-09-28 DIAGNOSIS — Z113 Encounter for screening for infections with a predominantly sexual mode of transmission: Secondary | ICD-10-CM

## 2017-09-28 DIAGNOSIS — O0990 Supervision of high risk pregnancy, unspecified, unspecified trimester: Secondary | ICD-10-CM

## 2017-09-28 NOTE — Progress Notes (Signed)
Offered Flu shot wants to think over

## 2017-09-28 NOTE — Progress Notes (Signed)
Subjective:  Abigail Salazar is a 27 y.o. G2P1001 at [redacted]w[redacted]d being seen today for ongoing prenatal care.  She is currently monitored for the following issues for this low-risk pregnancy and has Supervision of high risk pregnancy, antepartum; Limited prenatal care in second trimester; History of oligohydramnios in prior pregnancy, currently pregnant in second trimester; History of cesarean delivery, currently pregnant; and Language barrier on their problem list.  Patient reports general discomforts .  Contractions: Irritability. Vag. Bleeding: None.  Movement: Present. Denies leaking of fluid.   The following portions of the patient's history were reviewed and updated as appropriate: allergies, current medications, past family history, past medical history, past social history, past surgical history and problem list. Problem list updated.  Objective:   Vitals:   09/28/17 1526  BP: 100/61  Pulse: 85  Weight: 144 lb 4.8 oz (65.5 kg)    Fetal Status: Fetal Heart Rate (bpm): 136   Movement: Present     General:  Alert, oriented and cooperative. Patient is in no acute distress.  Skin: Skin is warm and dry. No rash noted.   Cardiovascular: Normal heart rate noted  Respiratory: Normal respiratory effort, no problems with respiration noted  Abdomen: Soft, gravid, appropriate for gestational age. Pain/Pressure: Present     Pelvic:  Cervical exam deferred        Extremities: Normal range of motion.  Edema: None  Mental Status: Normal mood and affect. Normal behavior. Normal judgment and thought content.   Urinalysis:      Assessment and Plan:  Pregnancy: G2P1001 at [redacted]w[redacted]d  1. Language barrier Interrupter used during today's visit  2. Supervision of high risk pregnancy, antepartum - GC/Chlamydia probe amp ([facility])not at [facility] - Culture, beta strep (group b only)  3. History of cesarean delivery, currently pregnant Repeat scheduled for 10/11/17  Preterm labor symptoms and general  obstetric precautions including but not limited to vaginal bleeding, contractions, leaking of fluid and fetal movement were reviewed in detail with the patient. Please refer to After Visit Summary for other counseling recommendations.  Return in about 1 week (around 10/05/2017) for OB visit.   Hermina Staggers, MD

## 2017-09-29 ENCOUNTER — Telehealth (HOSPITAL_COMMUNITY): Payer: Self-pay | Admitting: *Deleted

## 2017-09-29 LAB — GC/CHLAMYDIA PROBE AMP (~~LOC~~) NOT AT ARMC
CHLAMYDIA, DNA PROBE: NEGATIVE
NEISSERIA GONORRHEA: NEGATIVE

## 2017-09-29 NOTE — Telephone Encounter (Signed)
Preadmission screen  

## 2017-09-29 NOTE — Pre-Procedure Instructions (Signed)
Interpreter number [id number] 

## 2017-10-01 LAB — CULTURE, BETA STREP (GROUP B ONLY): STREP GP B CULTURE: POSITIVE — AB

## 2017-10-03 ENCOUNTER — Telehealth (HOSPITAL_COMMUNITY): Payer: Self-pay | Admitting: *Deleted

## 2017-10-03 NOTE — Telephone Encounter (Signed)
Preadmission screen  

## 2017-10-04 ENCOUNTER — Telehealth (HOSPITAL_COMMUNITY): Payer: Self-pay | Admitting: *Deleted

## 2017-10-04 NOTE — Telephone Encounter (Signed)
Preadmission screen  

## 2017-10-05 ENCOUNTER — Ambulatory Visit (INDEPENDENT_AMBULATORY_CARE_PROVIDER_SITE_OTHER): Payer: Self-pay | Admitting: Obstetrics and Gynecology

## 2017-10-05 ENCOUNTER — Ambulatory Visit: Payer: Self-pay

## 2017-10-05 VITALS — BP 105/69 | HR 93 | Wt 145.4 lb

## 2017-10-05 DIAGNOSIS — Z789 Other specified health status: Secondary | ICD-10-CM

## 2017-10-05 DIAGNOSIS — Z23 Encounter for immunization: Secondary | ICD-10-CM

## 2017-10-05 DIAGNOSIS — Z603 Acculturation difficulty: Secondary | ICD-10-CM

## 2017-10-05 DIAGNOSIS — O09293 Supervision of pregnancy with other poor reproductive or obstetric history, third trimester: Secondary | ICD-10-CM

## 2017-10-05 DIAGNOSIS — O0993 Supervision of high risk pregnancy, unspecified, third trimester: Secondary | ICD-10-CM

## 2017-10-05 DIAGNOSIS — O0990 Supervision of high risk pregnancy, unspecified, unspecified trimester: Secondary | ICD-10-CM

## 2017-10-05 DIAGNOSIS — O34219 Maternal care for unspecified type scar from previous cesarean delivery: Secondary | ICD-10-CM

## 2017-10-05 DIAGNOSIS — O99820 Streptococcus B carrier state complicating pregnancy: Secondary | ICD-10-CM

## 2017-10-05 NOTE — Progress Notes (Signed)
Interpreter Cicero Duck present for encounter.  Pt informed that the ultrasound is considered a limited OB ultrasound and is not intended to be a complete ultrasound exam.  Patient also informed that the ultrasound is not being completed with the intent of assessing for fetal or placental anomalies or any pelvic abnormalities.  Explained that the purpose of today's ultrasound is to assess for presentation and amniotic fluid volume.  Patient acknowledges the purpose of the exam and the limitations of the study.

## 2017-10-05 NOTE — Progress Notes (Signed)
Pt states this morning she felt that the baby moved a lot and that is causing her a lot of pain on her left & right side of her stomach. Did not take Tylenol.

## 2017-10-06 NOTE — Progress Notes (Signed)
Prenatal Visit Note Date: 10/05/2017 Clinic: [facility]  Subjective:  Abigail Salazar is a 27 y.o. G2P1001 at 3226w1d being seen today for ongoing prenatal care.  She is currently monitored for the following issues for this high-risk pregnancy and has Supervision of high risk pregnancy, antepartum; Limited prenatal care in second trimester; History of oligohydramnios in prior pregnancy, currently pregnant in second trimester; History of cesarean delivery, currently pregnant; Language barrier; and GBS (group B Streptococcus carrier), +RV culture, currently pregnant on their problem list.  Patient reports having some occasional abdominal pains, no VB, LOF.   Contractions: Irritability. Vag. Bleeding: None.  Movement: Present. Denies leaking of fluid.   The following portions of the patient's history were reviewed and updated as appropriate: allergies, current medications, past family history, past medical history, past social history, past surgical history and problem list. Problem list updated.  Objective:   Vitals:   10/05/17 1506  BP: 105/69  Pulse: 93  Weight: 145 lb 6.4 oz (66 kg)    Fetal Status: Fetal Heart Rate (bpm): 150 Fundal Height: 38 cm Movement: Present  Presentation: Vertex  General:  Alert, oriented and cooperative. Patient is in no acute distress.  Skin: Skin is warm and dry. No rash noted.   Cardiovascular: Normal heart rate noted  Respiratory: Normal respiratory effort, no problems with respiration noted  Abdomen: Soft, gravid, appropriate for gestational age. Pain/Pressure: Present     Pelvic:  Cervical exam deferred        Extremities: Normal range of motion.  Edema: None  Mental Status: Normal mood and affect. Normal behavior. Normal judgment and thought content.   Urinalysis:      Assessment and Plan:  Pregnancy: G2P1001 at 4226w1d  1. Supervision of high risk pregnancy, antepartum Routine care.  - Flu Vaccine QUAD 36+ mos  IM  2. Language barrier Interpreter used  3. GBS (group B Streptococcus carrier), +RV culture, currently pregnant  4. History of cesarean delivery, currently pregnant Rpt already scheduled  5. H/O oligohydramnios in prior pregnancy, currently pregnant, third trimester rNST and AFI normal today.  - Fetal nonstress test - US OB Limited; Future  Preterm labor symptoms and general obstetric precautions including but not limited to vaginal bleeding, contractions, leaking of fluid and fetal movement were reviewed in detail with the patient. Please refer to After Visit Summary for other counseling recommendations.  Return in about 3 weeks (around 10/26/2017) for incision check.   [place] BingPickens, [name], MD

## 2017-10-10 ENCOUNTER — Encounter (HOSPITAL_COMMUNITY): Payer: Self-pay

## 2017-10-10 ENCOUNTER — Encounter (HOSPITAL_COMMUNITY)
Admission: RE | Admit: 2017-10-10 | Discharge: 2017-10-10 | Disposition: A | Discharge status: Home / Self Care | Payer: Medicaid Other | Source: Ambulatory Visit | Attending: Family Medicine | Admitting: Family Medicine

## 2017-10-10 LAB — CBC
HCT: 33.6 % — ABNORMAL LOW (ref 36.0–46.0)
HEMOGLOBIN: 11.4 g/dL — AB (ref 12.0–15.0)
MCH: 30 pg (ref 26.0–34.0)
MCHC: 33.9 g/dL (ref 30.0–36.0)
MCV: 88.4 fL (ref 78.0–100.0)
Platelets: 166 10*3/uL (ref 150–400)
RBC: 3.8 MIL/uL — AB (ref 3.87–5.11)
RDW: 12.9 % (ref 11.5–15.5)
WBC: 6.4 10*3/uL (ref 4.0–10.5)

## 2017-10-10 LAB — TYPE AND SCREEN
ABO/RH(D): O POS
ANTIBODY SCREEN: NEGATIVE

## 2017-10-10 NOTE — Patient Instructions (Signed)
Instrucciones Para Antes de la Ciruga   Su ciruga est programada para 10/11/2017  (your procedure is scheduled on) Entre por la entrada principal del [facility]  a las [address] de [address] [place] -(enter through the main entrance at [facility] at [employer] AM    [address], [place] [phone] para informarnos de su llegada. (pick up phone, dial [phone] on arrival)     Por favor llame al [phone] si tiene algn problema en la maana de la ciruga (please call this number if you have any problems the morning of surgery.)                  Recuerde: (Remember)  No coma alimentos. (Do not eat food (After Midnight) Desps de medianoche)    No tome lquidos claros. (Do not drink clear liquids (After Midnight) Desps de medianoche)    No use joyas, maquillaje de ojos, lpiz labial, crema para el cuerpo o esmalte de uas oscuro. (Do not wear jewelry, eye makeup, lipstick, body lotion, or dark fingernail polish). Puede usar desodorante (you may wear deodorant)    No se afeite 48 horas de su ciruga. (Do not shave 48 hours before your surgery)    No traiga objetos de valor al hospital.  [place] no se hace responsable de ninguna pertenencia, ni objetos de valor que haya trado al hospital. (Do not bring valuable to the hospital.  [place] is not responsible for any belongings or valuables brought to the hospital)   [facility] estas medicinas en la maana de la ciruga con un SORBITO de agua nada (take these meds the morning of surgery with a SIP of water)     Durante la ciruga no se pueden usar lentes de contacto, dentaduras o puentes. (Contacts, dentures or bridgework cannot be worn in surgery).   Si va a ser ingresado despus de la ciruga, deje la [employer] Corporationmaleta en el carro hasta que se le haya asignado una habitacin. (If you are to be admitted after surgery, leave suitcase in car until your room has been assigned.)   A los pacientes que se les d de alta  el mismo da no se les permitir manejar a casa.  (Patients discharged on the day of surgery will not be allowed to drive home)    [place] del [employer] na. (Name and telephone number of your driver)   Instrucciones especiales N/A (Special Instructions)   Por favor, lea las hojas informativas que le entregaron. (Please read over the following fact sheets that you were given) Surgical Site Infection Prevention

## 2017-10-10 NOTE — Anesthesia Preprocedure Evaluation (Addendum)
Anesthesia Evaluation  Patient identified by MRN, date of birth, ID band Patient awake    Reviewed: Allergy & Precautions, NPO status , Patient's Chart, lab work & pertinent test results  Airway Mallampati: II  TM Distance: >3 FB Neck ROM: Full    Dental no notable dental hx. (+) Teeth Intact   Pulmonary neg pulmonary ROS,    Pulmonary exam normal breath sounds clear to auscultation       Cardiovascular Exercise Tolerance: Good hypertension, Pt. on medications Normal cardiovascular exam Rhythm:Regular Rate:Normal     Neuro/Psych negative neurological ROS  negative psych ROS   GI/Hepatic negative GI ROS,   Endo/Other    Renal/GU      Musculoskeletal   Abdominal   Peds  Hematology   Anesthesia Other Findings Pt Has oligohydramnios and Spanish speaking  Reproductive/Obstetrics (+) Pregnancy                            Anesthesia Physical Anesthesia Plan  ASA: II  Anesthesia Plan: Spinal   Post-op Pain Management:    Induction:   PONV Risk Score and Plan:   Airway Management Planned: Natural Airway and Nasal Cannula  Additional Equipment:   Intra-op Plan:   Post-operative Plan:   Informed Consent: I have reviewed the patients History and Physical, chart, labs and discussed the procedure including the risks, benefits and alternatives for the proposed anesthesia with the patient or authorized representative who has indicated his/her understanding and acceptance.     Plan Discussed with:   Anesthesia Plan Comments:         Anesthesia Quick Evaluation

## 2017-10-11 ENCOUNTER — Encounter (HOSPITAL_COMMUNITY)
Admission: AD | Disposition: A | Discharge status: Home / Self Care | Payer: Self-pay | Source: Home / Self Care | Attending: Family Medicine

## 2017-10-11 ENCOUNTER — Inpatient Hospital Stay (HOSPITAL_COMMUNITY)
Admission: AD | Admit: 2017-10-11 | Discharge: 2017-10-13 | DRG: 788 | Disposition: A | Discharge status: Home / Self Care | Payer: Medicaid Other | Attending: Family Medicine | Admitting: Family Medicine

## 2017-10-11 ENCOUNTER — Inpatient Hospital Stay (HOSPITAL_COMMUNITY): Payer: Medicaid Other | Admitting: Anesthesiology

## 2017-10-11 ENCOUNTER — Encounter (HOSPITAL_COMMUNITY): Payer: Self-pay | Admitting: Certified Registered Nurse Anesthetist

## 2017-10-11 DIAGNOSIS — Z3A39 39 weeks gestation of pregnancy: Secondary | ICD-10-CM

## 2017-10-11 DIAGNOSIS — O99824 Streptococcus B carrier state complicating childbirth: Secondary | ICD-10-CM | POA: Diagnosis present

## 2017-10-11 DIAGNOSIS — O34211 Maternal care for low transverse scar from previous cesarean delivery: Principal | ICD-10-CM | POA: Diagnosis present

## 2017-10-11 DIAGNOSIS — O9902 Anemia complicating childbirth: Secondary | ICD-10-CM | POA: Diagnosis present

## 2017-10-11 DIAGNOSIS — D649 Anemia, unspecified: Secondary | ICD-10-CM | POA: Diagnosis present

## 2017-10-11 DIAGNOSIS — O34219 Maternal care for unspecified type scar from previous cesarean delivery: Secondary | ICD-10-CM | POA: Diagnosis present

## 2017-10-11 DIAGNOSIS — O0932 Supervision of pregnancy with insufficient antenatal care, second trimester: Secondary | ICD-10-CM

## 2017-10-11 DIAGNOSIS — O99820 Streptococcus B carrier state complicating pregnancy: Secondary | ICD-10-CM

## 2017-10-11 DIAGNOSIS — Z789 Other specified health status: Secondary | ICD-10-CM | POA: Diagnosis present

## 2017-10-11 DIAGNOSIS — Z98891 History of uterine scar from previous surgery: Secondary | ICD-10-CM

## 2017-10-11 DIAGNOSIS — O0990 Supervision of high risk pregnancy, unspecified, unspecified trimester: Secondary | ICD-10-CM

## 2017-10-11 LAB — RPR: RPR Ser Ql: NONREACTIVE

## 2017-10-11 SURGERY — Surgical Case
Anesthesia: Spinal | Site: Abdomen | Wound class: Clean Contaminated

## 2017-10-11 MED ORDER — WITCH HAZEL-GLYCERIN EX PADS: 1.0000 "application " | MEDICATED_PAD | CUTANEOUS | Status: DC | PRN

## 2017-10-11 MED ORDER — FENTANYL CITRATE (PF) 100 MCG/2ML IJ SOLN
INTRAMUSCULAR | Status: DC | PRN
  Administered 2017-10-11: 15 ug via INTRATHECAL

## 2017-10-11 MED ORDER — SCOPOLAMINE 1 MG/3DAYS TD PT72
1.0000 | MEDICATED_PATCH | Freq: Once | TRANSDERMAL | Status: DC
  Administered 2017-10-11: 1.5 mg via TRANSDERMAL

## 2017-10-11 MED ORDER — DIBUCAINE 1 % RE OINT: 1.0000 "application " | TOPICAL_OINTMENT | RECTAL | Status: DC | PRN

## 2017-10-11 MED ORDER — NALBUPHINE HCL 10 MG/ML IJ SOLN: 5.0000 mg | INTRAMUSCULAR | Status: DC | PRN

## 2017-10-11 MED ORDER — OXYTOCIN 10 UNIT/ML IJ SOLN
INTRAMUSCULAR | Status: AC
  Filled 2017-10-11: qty 4

## 2017-10-11 MED ORDER — NALOXONE HCL 0.4 MG/ML IJ SOLN: 0.4000 mg | INTRAMUSCULAR | Status: DC | PRN

## 2017-10-11 MED ORDER — SIMETHICONE 80 MG PO CHEW
80.0000 mg | CHEWABLE_TABLET | Freq: Three times a day (TID) | ORAL | Status: DC
  Administered 2017-10-11 – 2017-10-13 (×5): 80 mg via ORAL
  Filled 2017-10-11 (×5): qty 1

## 2017-10-11 MED ORDER — PRENATAL MULTIVITAMIN CH
1.0000 | ORAL_TABLET | Freq: Every day | ORAL | Status: DC
  Administered 2017-10-12 – 2017-10-13 (×2): 1 via ORAL
  Filled 2017-10-11 (×2): qty 1

## 2017-10-11 MED ORDER — FENTANYL CITRATE (PF) 100 MCG/2ML IJ SOLN
INTRAMUSCULAR | Status: AC
  Filled 2017-10-11: qty 2

## 2017-10-11 MED ORDER — DIPHENHYDRAMINE HCL 25 MG PO CAPS: 25.0000 mg | ORAL_CAPSULE | ORAL | Status: DC | PRN

## 2017-10-11 MED ORDER — MEASLES, MUMPS & RUBELLA VAC ~~LOC~~ INJ
0.5000 mL | INJECTION | Freq: Once | SUBCUTANEOUS | Status: DC
  Filled 2017-10-11: qty 0.5

## 2017-10-11 MED ORDER — ONDANSETRON HCL 4 MG/2ML IJ SOLN: 4.0000 mg | Freq: Three times a day (TID) | INTRAMUSCULAR | Status: DC | PRN

## 2017-10-11 MED ORDER — OXYCODONE HCL 5 MG PO TABS: 10.0000 mg | ORAL_TABLET | ORAL | Status: DC | PRN

## 2017-10-11 MED ORDER — DIPHENHYDRAMINE HCL 50 MG/ML IJ SOLN
12.5000 mg | INTRAMUSCULAR | Status: DC | PRN
  Administered 2017-10-11: 12.5 mg via INTRAVENOUS

## 2017-10-11 MED ORDER — NALBUPHINE HCL 10 MG/ML IJ SOLN: 5.0000 mg | Freq: Once | INTRAMUSCULAR | Status: DC | PRN

## 2017-10-11 MED ORDER — MORPHINE SULFATE (PF) 0.5 MG/ML IJ SOLN
INTRAMUSCULAR | Status: DC | PRN
  Administered 2017-10-11: .15 mg via INTRATHECAL

## 2017-10-11 MED ORDER — PHENYLEPHRINE 40 MCG/ML (10ML) SYRINGE FOR IV PUSH (FOR BLOOD PRESSURE SUPPORT)
PREFILLED_SYRINGE | INTRAVENOUS | Status: AC
  Filled 2017-10-11: qty 10

## 2017-10-11 MED ORDER — OXYTOCIN 40 UNITS IN LACTATED RINGERS INFUSION - SIMPLE MED: 2.5000 [IU]/h | INTRAVENOUS | Status: AC

## 2017-10-11 MED ORDER — SODIUM CHLORIDE 0.9% FLUSH: 3.0000 mL | INTRAVENOUS | Status: DC | PRN

## 2017-10-11 MED ORDER — SODIUM CHLORIDE 0.9 % IR SOLN
Status: DC | PRN
  Administered 2017-10-11: 1

## 2017-10-11 MED ORDER — LACTATED RINGERS IV SOLN
INTRAVENOUS | Status: DC | PRN
  Administered 2017-10-11: 10:00:00 via INTRAVENOUS

## 2017-10-11 MED ORDER — HYDROMORPHONE HCL 1 MG/ML IJ SOLN: 0.2500 mg | INTRAMUSCULAR | Status: DC | PRN

## 2017-10-11 MED ORDER — SIMETHICONE 80 MG PO CHEW: 80.0000 mg | CHEWABLE_TABLET | ORAL | Status: DC | PRN

## 2017-10-11 MED ORDER — ZOLPIDEM TARTRATE 5 MG PO TABS: 5.0000 mg | ORAL_TABLET | Freq: Every evening | ORAL | Status: DC | PRN

## 2017-10-11 MED ORDER — PHENYLEPHRINE HCL 10 MG/ML IJ SOLN
INTRAMUSCULAR | Status: DC | PRN
  Administered 2017-10-11: 80 ug via INTRAVENOUS

## 2017-10-11 MED ORDER — MEPERIDINE HCL 25 MG/ML IJ SOLN: 6.2500 mg | INTRAMUSCULAR | Status: DC | PRN

## 2017-10-11 MED ORDER — TETANUS-DIPHTH-ACELL PERTUSSIS 5-2.5-18.5 LF-MCG/0.5 IM SUSP: 0.5000 mL | Freq: Once | INTRAMUSCULAR | Status: DC

## 2017-10-11 MED ORDER — ACETAMINOPHEN 325 MG PO TABS
650.0000 mg | ORAL_TABLET | ORAL | Status: DC | PRN
  Administered 2017-10-11 – 2017-10-12 (×2): 650 mg via ORAL
  Filled 2017-10-11 (×2): qty 2

## 2017-10-11 MED ORDER — SENNOSIDES-DOCUSATE SODIUM 8.6-50 MG PO TABS
2.0000 | ORAL_TABLET | ORAL | Status: DC
  Administered 2017-10-12 (×2): 2 via ORAL
  Filled 2017-10-11 (×2): qty 2

## 2017-10-11 MED ORDER — LACTATED RINGERS IV SOLN
INTRAVENOUS | Status: DC
  Administered 2017-10-11: 16:00:00 via INTRAVENOUS

## 2017-10-11 MED ORDER — KETOROLAC TROMETHAMINE 30 MG/ML IJ SOLN
INTRAMUSCULAR | Status: AC
  Filled 2017-10-11: qty 1

## 2017-10-11 MED ORDER — KETOROLAC TROMETHAMINE 30 MG/ML IJ SOLN: 30.0000 mg | Freq: Four times a day (QID) | INTRAMUSCULAR | Status: AC | PRN

## 2017-10-11 MED ORDER — SCOPOLAMINE 1 MG/3DAYS TD PT72
MEDICATED_PATCH | TRANSDERMAL | Status: AC
  Filled 2017-10-11: qty 1

## 2017-10-11 MED ORDER — MENTHOL 3 MG MT LOZG: 1.0000 | LOZENGE | OROMUCOSAL | Status: DC | PRN

## 2017-10-11 MED ORDER — CEFAZOLIN SODIUM-DEXTROSE 2-4 GM/100ML-% IV SOLN
2.0000 g | INTRAVENOUS | Status: AC
  Administered 2017-10-11: 2 g via INTRAVENOUS
  Filled 2017-10-11: qty 100

## 2017-10-11 MED ORDER — PROMETHAZINE HCL 25 MG/ML IJ SOLN: 6.2500 mg | INTRAMUSCULAR | Status: DC | PRN

## 2017-10-11 MED ORDER — PHENYLEPHRINE 8 MG IN D5W 100 ML (0.08MG/ML) PREMIX OPTIME
INJECTION | INTRAVENOUS | Status: DC | PRN
  Administered 2017-10-11: 60 ug/min via INTRAVENOUS

## 2017-10-11 MED ORDER — MORPHINE SULFATE (PF) 0.5 MG/ML IJ SOLN
INTRAMUSCULAR | Status: AC
  Filled 2017-10-11: qty 10

## 2017-10-11 MED ORDER — HYDROCODONE-ACETAMINOPHEN 7.5-325 MG PO TABS: 1.0000 | ORAL_TABLET | Freq: Once | ORAL | Status: DC | PRN

## 2017-10-11 MED ORDER — LACTATED RINGERS IV SOLN: INTRAVENOUS | Status: DC | PRN

## 2017-10-11 MED ORDER — COCONUT OIL OIL: 1.0000 "application " | TOPICAL_OIL | Status: DC | PRN

## 2017-10-11 MED ORDER — DEXAMETHASONE SODIUM PHOSPHATE 4 MG/ML IJ SOLN
INTRAMUSCULAR | Status: DC | PRN
  Administered 2017-10-11: 4 mg via INTRAVENOUS

## 2017-10-11 MED ORDER — OXYTOCIN 10 UNIT/ML IJ SOLN
INTRAVENOUS | Status: DC | PRN
  Administered 2017-10-11: 40 [IU] via INTRAVENOUS

## 2017-10-11 MED ORDER — ACETAMINOPHEN 10 MG/ML IV SOLN: 1000.0000 mg | Freq: Once | INTRAVENOUS | Status: DC | PRN

## 2017-10-11 MED ORDER — LACTATED RINGERS IV SOLN
INTRAVENOUS | Status: DC
  Administered 2017-10-11 (×2): via INTRAVENOUS

## 2017-10-11 MED ORDER — SIMETHICONE 80 MG PO CHEW
80.0000 mg | CHEWABLE_TABLET | ORAL | Status: DC
  Administered 2017-10-12 (×2): 80 mg via ORAL
  Filled 2017-10-11 (×2): qty 1

## 2017-10-11 MED ORDER — OXYCODONE HCL 5 MG PO TABS
5.0000 mg | ORAL_TABLET | ORAL | Status: DC | PRN
  Administered 2017-10-12 (×2): 5 mg via ORAL
  Filled 2017-10-11 (×2): qty 1

## 2017-10-11 MED ORDER — KETOROLAC TROMETHAMINE 30 MG/ML IJ SOLN
30.0000 mg | Freq: Four times a day (QID) | INTRAMUSCULAR | Status: AC | PRN
  Administered 2017-10-11: 30 mg via INTRAVENOUS

## 2017-10-11 MED ORDER — ONDANSETRON HCL 4 MG/2ML IJ SOLN
INTRAMUSCULAR | Status: DC | PRN
  Administered 2017-10-11: 4 mg via INTRAVENOUS

## 2017-10-11 MED ORDER — IBUPROFEN 600 MG PO TABS
600.0000 mg | ORAL_TABLET | Freq: Four times a day (QID) | ORAL | Status: DC
  Administered 2017-10-11 – 2017-10-13 (×8): 600 mg via ORAL
  Filled 2017-10-11 (×8): qty 1

## 2017-10-11 MED ORDER — PHENYLEPHRINE 8 MG IN D5W 100 ML (0.08MG/ML) PREMIX OPTIME
INJECTION | INTRAVENOUS | Status: AC
  Filled 2017-10-11: qty 100

## 2017-10-11 MED ORDER — ENOXAPARIN SODIUM 40 MG/0.4ML ~~LOC~~ SOLN
40.0000 mg | SUBCUTANEOUS | Status: DC
  Administered 2017-10-12 – 2017-10-13 (×2): 40 mg via SUBCUTANEOUS
  Filled 2017-10-11 (×2): qty 0.4

## 2017-10-11 MED ORDER — DIPHENHYDRAMINE HCL 50 MG/ML IJ SOLN
INTRAMUSCULAR | Status: AC
  Filled 2017-10-11: qty 1

## 2017-10-11 MED ORDER — DEXAMETHASONE SODIUM PHOSPHATE 4 MG/ML IJ SOLN
INTRAMUSCULAR | Status: AC
  Filled 2017-10-11: qty 1

## 2017-10-11 MED ORDER — DIPHENHYDRAMINE HCL 25 MG PO CAPS
25.0000 mg | ORAL_CAPSULE | Freq: Four times a day (QID) | ORAL | Status: DC | PRN
  Administered 2017-10-11: 25 mg via ORAL
  Filled 2017-10-11: qty 1

## 2017-10-11 MED ORDER — ONDANSETRON HCL 4 MG/2ML IJ SOLN
INTRAMUSCULAR | Status: AC
  Filled 2017-10-11: qty 2

## 2017-10-11 MED ORDER — BUPIVACAINE IN DEXTROSE 0.75-8.25 % IT SOLN
INTRATHECAL | Status: DC | PRN
  Administered 2017-10-11: 1.5 mL via INTRATHECAL

## 2017-10-11 MED ORDER — NALOXONE HCL 4 MG/10ML IJ SOLN: 1.0000 ug/kg/h | INTRAVENOUS | Status: DC | PRN

## 2017-10-11 SURGICAL SUPPLY — 35 items
BENZOIN TINCTURE PRP APPL 2/3 (GAUZE/BANDAGES/DRESSINGS) ×3 IMPLANT
CHLORAPREP W/TINT 26ML (MISCELLANEOUS) ×3 IMPLANT
CLAMP CORD UMBIL (MISCELLANEOUS) IMPLANT
CLOSURE STERI STRIP 1/2 X4 (GAUZE/BANDAGES/DRESSINGS) ×3 IMPLANT
CLOTH BEACON ORANGE TIMEOUT ST (SAFETY) ×3 IMPLANT
DRSG OPSITE POSTOP 4X10 (GAUZE/BANDAGES/DRESSINGS) ×3 IMPLANT
ELECT REM PT RETURN 9FT ADLT (ELECTROSURGICAL) ×3
ELECTRODE REM PT RTRN 9FT ADLT (ELECTROSURGICAL) ×1 IMPLANT
EXTRACTOR VACUUM M CUP 4 TUBE (SUCTIONS) IMPLANT
EXTRACTOR VACUUM M CUP 4' TUBE (SUCTIONS)
GLOVE BIOGEL PI IND STRL 7.0 (GLOVE) ×2 IMPLANT
GLOVE BIOGEL PI IND STRL 7.5 (GLOVE) ×2 IMPLANT
GLOVE BIOGEL PI INDICATOR 7.0 (GLOVE) ×4
GLOVE BIOGEL PI INDICATOR 7.5 (GLOVE) ×4
GLOVE ECLIPSE 7.5 STRL STRAW (GLOVE) ×3 IMPLANT
GOWN STRL REUS W/TWL LRG LVL3 (GOWN DISPOSABLE) ×9 IMPLANT
KIT ABG SYR 3ML LUER SLIP (SYRINGE) IMPLANT
NEEDLE HYPO 25X5/8 SAFETYGLIDE (NEEDLE) IMPLANT
NS IRRIG 1000ML POUR BTL (IV SOLUTION) ×3 IMPLANT
PACK C SECTION WH (CUSTOM PROCEDURE TRAY) ×3 IMPLANT
PAD OB MATERNITY 4.3X12.25 (PERSONAL CARE ITEMS) ×3 IMPLANT
PENCIL SMOKE EVAC W/HOLSTER (ELECTROSURGICAL) ×3 IMPLANT
RTRCTR C-SECT PINK 25CM LRG (MISCELLANEOUS) ×3 IMPLANT
STRIP CLOSURE SKIN 1/2X4 (GAUZE/BANDAGES/DRESSINGS) ×2 IMPLANT
SUT MNCRL 0 VIOLET CTX 36 (SUTURE) ×1 IMPLANT
SUT MONOCRYL 0 CTX 36 (SUTURE) ×2
SUT PLAIN 2 0 (SUTURE) ×2
SUT PLAIN ABS 2-0 CT1 27XMFL (SUTURE) ×1 IMPLANT
SUT VIC AB 0 CTX 36 (SUTURE) ×6
SUT VIC AB 0 CTX36XBRD ANBCTRL (SUTURE) ×3 IMPLANT
SUT VIC AB 2-0 CT1 27 (SUTURE) ×2
SUT VIC AB 2-0 CT1 TAPERPNT 27 (SUTURE) ×1 IMPLANT
SUT VIC AB 4-0 KS 27 (SUTURE) ×3 IMPLANT
TOWEL OR 17X24 6PK STRL BLUE (TOWEL DISPOSABLE) ×3 IMPLANT
TRAY FOLEY W/BAG SLVR 14FR LF (SET/KITS/TRAYS/PACK) ×3 IMPLANT

## 2017-10-11 NOTE — Op Note (Signed)
Cesarean Section Operative Report  PATIENT: Abigail Salazar  PROCEDURE DATE: 10/11/2017  PREOPERATIVE DIAGNOSES: Intrauterine pregnancy at [redacted]w[redacted]d weeks gestation; patient declines vag del attempt  POSTOPERATIVE DIAGNOSES: The same  PROCEDURE: Repeat Low Transverse Cesarean Section  SURGEON:   Surgeon(s) and Role:    * Levie Heritage, DO - Primary    * Arvilla Market, DO - Assisting - OB Fellow    INDICATIONS: [name] is a 27 y.o. [id number] at [redacted]w[redacted]d here for cesarean section secondary to the indications listed under preoperative diagnoses; please see preoperative note for further details.  The risks of cesarean section were discussed with the patient including but were not limited to: bleeding which may require transfusion or reoperation; infection which may require antibiotics; injury to bowel, bladder, ureters or other surrounding organs; injury to the fetus; need for additional procedures including hysterectomy in the event of a life-threatening hemorrhage; placental abnormalities wth subsequent pregnancies, incisional problems, thromboembolic phenomenon and other postoperative/anesthesia complications.   The patient concurred with the proposed plan, giving informed written consent for the procedure.    FINDINGS:  Viable female infant in cephalic presentation.  Apgars 9 and 9.  Clear amniotic fluid.  Intact placenta, three vessel cord.  Normal uterus, fallopian tubes and ovaries bilaterally.  ANESTHESIA: Spinal INTRAVENOUS FLUIDS: 1300 mL  ESTIMATED BLOOD LOSS: 165 mL URINE OUTPUT:  300 ml SPECIMENS: Placenta sent to L&D COMPLICATIONS: None immediate  PROCEDURE IN DETAIL:  The patient preoperatively received intravenous antibiotics and had sequential compression devices applied to her lower extremities.  She was then taken to the operating room where spinal anesthesia was administered and was found to be adequate. She was then placed in a dorsal supine  position with a leftward tilt, and prepped and draped in a sterile manner.  A foley catheter was placed into her bladder and attached to constant gravity.    After an adequate timeout was performed, a Pfannenstiel skin incision was made with scalpel and carried through to the underlying layer of fascia. The fascia was incised in the midline, and this incision was extended bilaterally using the Mayo scissors.  Kocher clamps were applied to the superior aspect of the fascial incision and the underlying rectus muscles were dissected off bluntly.  The rectus muscles were separated in the midline bluntly and the peritoneum was entered bluntly. Attention was turned to the lower uterine segment where a low transverse hysterotomy was made with a scalpel and extended bilaterally bluntly.  The infant was successfully delivered, the cord was clamped and cut after one minute, and the infant was handed over to the awaiting neonatology team. Uterine massage was then administered, and the placenta delivered intact with a three-vessel cord. The uterus was then cleared of clots and debris.  The hysterotomy was closed with 0 Vicryl in a running locked fashion, and an imbricating layer was also placed with 0 Monocryl.  The pelvis was cleared of all clot and debris. Hemostasis was confirmed on all surfaces.  The peritoneum was closed with a 0 Vicryl running stitch. The fascia was then closed using 0 Vicryl in a running fashion.  The subcutaneous layer was irrigated, then reapproximated with 2-0 plain gut running stich..  The skin was closed with a 4-0 Vicryl subcuticular stitch.   The patient tolerated the procedure well. Sponge, lap, instrument and needle counts were correct x 3.  She was taken to the recovery room in stable condition.   An experienced assistant was required given the standard of  surgical care given the complexity of the case.  This assistant was needed for exposure, dissection, suctioning, retraction,  instrument exchange, assisting with delivery with administration of fundal pressure, and for overall help during the procedure.   Maternal Disposition: PACU - hemodynamically stable.   Infant Disposition: stable   Marcy Sirenatherine Wallace, D.O. OB Fellow  10/11/2017, 10:35 AM

## 2017-10-11 NOTE — Anesthesia Postprocedure Evaluation (Signed)
Anesthesia Post Note  Patient: Danielys Alejandro-Lopez  Procedure(s) Performed: REPEAT CESAREAN SECTION (N/A Abdomen)     Patient location during evaluation: Mother Baby Anesthesia Type: Spinal Level of consciousness: awake and alert and oriented Pain management: satisfactory to patient Vital Signs Assessment: post-procedure vital signs reviewed and stable Respiratory status: spontaneous breathing and nonlabored ventilation Cardiovascular status: stable Postop Assessment: no headache, no backache, patient able to bend at knees, no signs of nausea or vomiting and adequate PO intake Anesthetic complications: no    Last Vitals:  Vitals:   10/11/17 1430 10/11/17 1530  BP: 99/60 (!) 90/59  Pulse: 83 68  Resp: 18 18  Temp: 37.4 C 37.1 C  SpO2: 99% 98%    Last Pain:  Vitals:   10/11/17 1530  TempSrc: Oral  PainSc:    Pain Goal: Patients Stated Pain Goal: 3 (10/11/17 1304)               Madison HickmanGREGORY,Donatella Walski

## 2017-10-11 NOTE — Anesthesia Procedure Notes (Addendum)
Spinal  Patient location during procedure: OB Start time: 10/11/2017 9:13 AM End time: 10/11/2017 9:16 AM Staffing Anesthesiologist: Trevor IhaHouser, Yaa Donnellan A, MD Performed: anesthesiologist  Preanesthetic Checklist Completed: patient identified, surgical consent, pre-op evaluation, timeout performed, IV checked, risks and benefits discussed and monitors and equipment checked Spinal Block Patient position: sitting Prep: site prepped and draped and DuraPrep Patient monitoring: heart rate, cardiac monitor, continuous pulse ox and blood pressure Approach: midline Location: L3-4 Injection technique: single-shot Needle Needle type: Pencan  Needle gauge: 24 G Needle length: 10 cm Needle insertion depth: 5 cm Assessment Sensory level: T4 Additional Notes 1 attempt patient tolerated procedure well

## 2017-10-11 NOTE — H&P (Signed)
Faculty Practice H&P  Abigail Salazar is a 27 y.o. female G2P1001 with IUP at [redacted]w[redacted]d presenting for elective repeat cesarean section. Pregnancy was been complicated by limited prenatal care.    Pt states she has been having no contractions, no vaginal bleeding, intact membranes, with normal fetal movement.     Prenatal Course Source of Care: [facility] with onset of care at 28weeks  Pregnancy complications or risks: Patient Active Problem List   Diagnosis Date Noted  . GBS (group B Streptococcus carrier), +RV culture, currently pregnant 10/05/2017  . Language barrier 08/12/2017  . Supervision of high risk pregnancy, antepartum 06/30/2017  . Limited prenatal care in second trimester 06/30/2017  . History of oligohydramnios in prior pregnancy, currently pregnant in second trimester 06/30/2017  . History of cesarean delivery, currently pregnant 06/30/2017   She desires condoms for contraception.  She plans to breastfeed  Prenatal labs and studies: ABO, Rh: --/--/O POS (09/16 1100) Antibody: NEG (09/16 1100) Rubella: 2.14 (06/04 1745) RPR: Non Reactive (09/16 1100)  HBsAg: Negative (06/04 1745)  HIV: Non Reactive (06/04 1745)  GBS:    2hr Glucola: positive Genetic screening: late to care Anatomy US: normal  Past Medical History:  Past Medical History:  Diagnosis Date  . Hypertension    chronic HTN prior to pregnancy. - no meds    Past Surgical History:  Past Surgical History:  Procedure Laterality Date  . CESAREAN SECTION     classical incision unsure of internal incision     Obstetrical History:  OB History    Gravida  2   Para  1   Term  1   Preterm      AB      Living  1     SAB      TAB      Ectopic      Multiple      Live Births  1           Gynecological History:  OB History    Gravida  2   Para  1   Term  1   Preterm      AB      Living  1     SAB      TAB      Ectopic      Multiple      Live Births  1           Social History:  Social History   Socioeconomic History  . Marital status: Single    Spouse name: Not on file  . Number of children: Not on file  . Years of education: Not on file  . Highest education level: Not on file  Occupational History  . Not on file  Social Needs  . Financial resource strain: Not hard at all  . Food insecurity:    Worry: Not on file    Inability: Not on file  . Transportation needs:    Medical: No    Non-medical: Not on file  Tobacco Use  . Smoking status: Never Smoker  . Smokeless tobacco: Never Used  Substance and Sexual Activity  . Alcohol use: Not Currently  . Drug use: Not Currently  . Sexual activity: Not Currently    Birth control/protection: Injection  Lifestyle  . Physical activity:    Days per week: Not on file    Minutes per session: Not on file  . Stress: Not on file  Relationships  . Social connections:  Talks on phone: Not on file    Gets together: Not on file    Attends religious service: Not on file    Active member of club or organization: Not on file    Attends meetings of clubs or organizations: Not on file    Relationship status: Not on file  Other Topics Concern  . Not on file  Social History Narrative  . Not on file    Family History:  Family History  Problem Relation Age of Onset  . Arthritis Father   . Hypertension Father     Medications:  Prenatal vitamins,  Current Facility-Administered Medications  Medication Dose Route Frequency Provider Last Rate Last Dose  . ceFAZolin (ANCEF) IVPB 2g/100 mL premix  2 g Intravenous On Call to OR Constant, Peggy, MD      . lactated ringers infusion   Intravenous Continuous Trevor IhaHouser, Stephen A, MD 125 mL/hr at 10/11/17 0806      Allergies: No Known Allergies  Review of Systems: - negative  Physical Exam: Blood pressure 104/73, pulse 85, temperature 98.1 F (36.7 C), temperature source Oral, resp. rate 18, height 4\' 11"  (1.499 m), weight 66.2 kg, last  menstrual period 01/11/2017, unknown if currently breastfeeding. GENERAL: Well-developed, well-nourished female in no acute distress.  LUNGS: Clear to auscultation bilaterally.  HEART: Regular rate and rhythm. ABDOMEN: Soft, nontender, nondistended, gravid. EFW 7.5 lbs EXTREMITIES: Nontender, no edema, 2+ distal pulses.    Pertinent Labs/Studies:   Lab Results  Component Value Date   WBC 6.4 10/10/2017   HGB 11.4 (L) 10/10/2017   HCT 33.6 (L) 10/10/2017   MCV 88.4 10/10/2017   PLT 166 10/10/2017    Assessment : Abigail Salazar is a 27 y.o. G2P1001 at 3175w0d being admitted for cesarean section secondary to prior cesarean delivery  Plan: The risks of cesarean section discussed with the patient included but were not limited to: bleeding which may require transfusion or reoperation; infection which may require antibiotics; injury to bowel, bladder, ureters or other surrounding organs; injury to the fetus; need for additional procedures including hysterectomy in the event of a life-threatening hemorrhage; placental abnormalities wth subsequent pregnancies, incisional problems, thromboembolic phenomenon and other postoperative/anesthesia complications. The patient concurred with the proposed plan, giving informed written consent for the procedure.   Patient has been NPO since last night and will remain NPO for procedure.  Preoperative prophylactic Ancef ordered on call to the OR.    Levie HeritageStinson, Tyanna Hach J, DO 10/11/2017, 9:02 AM

## 2017-10-11 NOTE — Transfer of Care (Signed)
Immediate Anesthesia Transfer of Care Note  Patient: Abigail Salazar  Procedure(s) Performed: REPEAT CESAREAN SECTION (N/A Abdomen)  Patient Location: PACU  Anesthesia Type:Spinal  Level of Consciousness: awake, alert  and oriented  Airway & Oxygen Therapy: Patient Spontanous Breathing  Post-op Assessment: Report given to RN and Post -op Vital signs reviewed and stable  Post vital signs: Reviewed and stable  Last Vitals:  Vitals Value Taken Time  BP 89/58 10/11/2017 10:45 AM  Temp    Pulse 65 10/11/2017 10:46 AM  Resp 17 10/11/2017 10:46 AM  SpO2 99 % 10/11/2017 10:46 AM  Vitals shown include unvalidated device data.  Last Pain:  Vitals:   10/11/17 0800  TempSrc: Oral  PainSc:       Patients Stated Pain Goal: 4 (10/11/17 16100742)  Complications: No apparent anesthesia complications

## 2017-10-12 LAB — CREATININE, SERUM
Creatinine, Ser: 0.59 mg/dL (ref 0.44–1.00)
GFR calc non Af Amer: >60 mL/min (ref >60)

## 2017-10-12 LAB — CBC
HCT: 25.8 % — ABNORMAL LOW (ref 36.0–46.0)
Hemoglobin: 8.8 g/dL — ABNORMAL LOW (ref 12.0–15.0)
MCH: 30.4 pg (ref 26.0–34.0)
MCHC: 34.1 g/dL (ref 30.0–36.0)
MCV: 89.3 fL (ref 78.0–100.0)
PLATELETS: 147 10*3/uL — AB (ref 150–400)
RBC: 2.89 MIL/uL — ABNORMAL LOW (ref 3.87–5.11)
RDW: 12.9 % (ref 11.5–15.5)
WBC: 10.3 10*3/uL (ref 4.0–10.5)

## 2017-10-12 MED ORDER — FERROUS SULFATE 325 (65 FE) MG PO TABS
325.0000 mg | ORAL_TABLET | Freq: Two times a day (BID) | ORAL | Status: DC
  Administered 2017-10-12 – 2017-10-13 (×2): 325 mg via ORAL
  Filled 2017-10-12 (×2): qty 1

## 2017-10-12 NOTE — Progress Notes (Addendum)
Post Partum Day 1 Subjective: Tiaira Lonzo Candylejandro-Lopez is a 27 year old female POD #1 for rLTCS. She is G2P2. Interpreter was present for entire encounter. Patient reports that she is doing good. She states she has been ambulating since last night. She also reports eating without nausea. She states that her vaginal bleeding is less than menses. Patient denies voiding, bowel movements, or flatus. Denies HA, vision change, and RUQ pain. She reports that she is bottle feeding and is undecided about future contraception.   Objective: Blood pressure 116/60, pulse 70, temperature 98.9 F (37.2 C), temperature source Oral, resp. rate 18, height 4\' 11"  (1.499 m), weight 66.2 kg, last menstrual period 01/11/2017, SpO2 98 %, unknown if currently breastfeeding.  Physical Exam:  General: alert and cooperative  Heart: RRR without murmurs or gallops. Lungs: CTA without wheezes or crackles.  Uterine Fundus: firm, palpable DVT Evaluation: Bilateral lower extremities without edema. Negative Homan's sign. No pain on palpation of bilateral lower extremities.  Recent Labs    10/10/17 1100 10/12/17 0555  HGB 11.4* 8.8*  HCT 33.6* 25.8*    Assessment/Plan: Assessment: Patient is POD day 1. She appears to be in stable condition tolerating activity and PO intake.  Plan: Monitor patient for increased pain or vaginal bleeding. Follow-up after breakfast to determine if patient has flatus, bowel movement, or voided. Offer patient more information about contraceptive options if she desires or has any questions.    LOS: 1 day   Charyl Dancerrin Horst 10/12/2017, 7:41 AM   OB FELLOW MEDICAL STUDENT NOTE ATTESTATION  I confirm that I have verified the information documented in the PA student's note and that I have also personally performed the physical exam and all medical decision making activities.   Late cosign. Patient seen on PP rounds on AM 9/18.   Tawana Scaleablina Alejandro-Lopez is 27 y.o. [id number] female POD#1 from rLTCS.  Patient reports feeling well. Ambulating without difficulty, voiding normally, lochia is mild, pain is well controlled. Has not yet had flatus or bowel movement. Vital signs stable. Physical exam benign with firm uterine fundus and no LE edema. Abdomen is soft with +BS. Incision is clean/dry/intact. Patient is bottle feeding. Plans for undecided for contraception. Plan for d/c on POD#2 or #3.   Marcy Sirenatherine Wallace, D.O. OB Fellow  10/14/2017, 11:58 AM

## 2017-10-12 NOTE — Lactation Note (Signed)
This note was copied from a baby's chart. Lactation Consultation Note  Patient Name: Abigail Salazar ZOXWR'UToday's Date: 10/12/2017 Reason for consult: Follow-up assessment;Term;Infant weight loss;Difficult latch  [place] interpreter Debarah Crape- Claudia [phone]- #[phone]  As LC entered the room 1445, dad ( speaks English informed LC baby had not eaten since  9 am. And been sleeping all day.  LC reviewed with mom and dad that baby should be fed with feeding cues and if it has been awhile  Since feeding, to check diaper / change the baby and place STS.  Mom confirmed even though she has been giving several bottles she desires to breastfeed.  Mom mentioned she had a Difficult time breast feeding her baby and ended up switching to  Bottle only.  LC checked and changed a  Extra large green stool , filled the diaper, and wet.  Placed baby STS, attempted to latch without success, started a # 24 NS after pre- pumping  Due to  Semi compressible areolas, ( right nipple more compressible compared to left - semi inverted).  Instilled formula in the top, baby at 1st nibbled her way on and was sucking her upper lip.  LC unlatched and worked with the baby to open wide and baby latched and ended up feeding 20  Minutes with multiple swallows and mosm milk noted in the NS after baby released.  Baby had another large green stool after feeding.  Baby acting hungry and LC assisted to latch on the left after pre- pumping and had mom apply the  #24 NS , instilled formula in the top and baby fed for 25 mins. Softened both breast down.  Breast are filling and mom feels milk is coming in.  [facility] recommended [facility] plan  To breast feed/ since the baby has had several bottles  May need supplementing afterwards.  Breast shells between feedings except when sleeping ( MBURN aware to assist mom and instruct)  Prior to latch pre-pump after hand express, apply #24 NS ( mom was able to apply properly )  And instill EBM or  formula in the top )  Latch with firm support and offer 2nd breast at same feeding.  Supplement afterwards with 20 -30 ml of EBM or formula.  Post pump both breast for 15 -20 mins ,  Save milk for next feeding. [facility]( MBURN aware to set mom up  With the DEBP ( LC placed in room ) after mom eats lunch.   Milk is in please check mom for potential engorgement.     Maternal Data Has patient been taught Hand Expression?: Yes Does the patient have breastfeeding experience prior to this delivery?: Yes  Feeding Feeding Type: Breast Milk with Formula added(pre- pumped ) Length of feed: 25 min  LATCH Score Latch: Grasps breast easily, tongue down, lips flanged, rhythmical sucking.  Audible Swallowing: Spontaneous and intermittent  Type of Nipple: Everted at rest and after stimulation  Comfort (Breast/Nipple): Filling, red/small blisters or bruises, mild/mod discomfort  Hold (Positioning): Assistance needed to correctly position infant at breast and maintain latch.  LATCH Score: 8  Interventions Interventions: Breast feeding basics reviewed  Lactation Tools Discussed/Used Tools: Pump;Shells Nipple shield size: 24 Shell Type: Inverted(MBURN aware mom will need shells ) Breast pump type: Manual;Double-Electric Breast Pump(RN to set up the DEBP and get mom shells ) Pump Review: Milk Storage;Setup, frequency, and cleaning(showed  mom how to use it ) Initiated by:: LC  reviewed / MAI    Consult Status  Consult Status: Follow-up Date: 10/13/17 Follow-up type: In-patient    Abigail Salazar 10/12/2017, 4:38 PM

## 2017-10-12 NOTE — Progress Notes (Signed)
Due to high hospital census and acuity, CSW is unable to meet with MOB to complete assessment [facility].  CSW will continue to monitor infant's UDS and CDS. CSW will make report to Child Protective Services if warranted.  Please consult CSW if concerns arise or by MOB's request.  Blaine HamperAngel Boyd-Gilyard, MSW, LCSW Clinical Social Work [phone]

## 2017-10-13 MED ORDER — OXYCODONE HCL 5 MG PO TABS: 5.0000 mg | ORAL_TABLET | ORAL | 0 refills | Status: AC | PRN

## 2017-10-13 MED ORDER — IBUPROFEN 600 MG PO TABS: 600.0000 mg | ORAL_TABLET | Freq: Four times a day (QID) | ORAL | 0 refills | Status: AC | PRN

## 2017-10-13 MED ORDER — FERROUS SULFATE 325 (65 FE) MG PO TABS: 325.0000 mg | ORAL_TABLET | Freq: Two times a day (BID) | ORAL | 3 refills | Status: AC

## 2017-10-13 NOTE — Discharge Instructions (Signed)
Parto por Lonna Duvalcesrea, cuidados posteriores Cesarean Delivery, Care After Siga estas instrucciones durante las prximas semanas. Estas indicaciones le proporcionan informacin acerca de cmo deber cuidarse despus del procedimiento. Su mdico tambin podr darle indicaciones ms especficas. El tratamiento ha sido planificado segn las prcticas mdicas actuales, pero en algunos casos pueden ocurrir problemas. Comunquese con el mdico si tiene algn problema o preguntas despus del procedimiento. Qu puedo esperar despus del procedimiento? Despus del procedimiento, es comn DIRECTVtener los siguientes sntomas:  Una pequea cantidad de sangre o un lquido transparente que sale de la incisin.  Tiene enrojecimiento, hinchazn o dolor en la zona de la incisin.  Dolores y [employer] molestias abdominales.  Hemorragia vaginal (loquios).  Calambres plvicos.  Fatiga.  Siga estas indicaciones en su casa: Cuidados de la incisin   Siga las indicaciones del mdico acerca del cuidado de la incisin. Haga lo siguiente: ? Lvese las manos con agua y jabn antes de cambiar la venda (vendaje). Use desinfectante para manos si no dispone de [place] y [place]jabn. ? Cambie el vendaje como se lo haya indicado el mdico. ? No retire los puntos (suturas), las grapas cutneas, la goma para cerrar la piel o las tiras Sligoadhesivas. Es posible que estos cierres cutneos Conservation officer, naturedeban permanecer en la piel durante 2semanas o ms tiempo. Si los bordes de las tiras [address]adhesivas empiezan a despegarse y Scientific laboratory technicianenroscarse, puede recortar los que estn sueltos. No retire las tiras Agilent Technologiesadhesivas por completo a menos que el mdico se lo indique.  Controle todos los das la zona de la incisin para detectar signos de infeccin. Est atenta a los siguientes signos: ? Aumento del enrojecimiento, la hinchazn o Chief Technology Officerel dolor. ? Mayor presencia de lquido o Afftonsangre. ? Calor. ? Pus o mal olor.  Cuando tosa o estornude, abrace Rockwell Automationuna almohada. Esto ayuda con el dolor y Apple Computerdisminuye  la posibilidad de que su incisin se abra (dehiscencia). Haga esto hasta que cicatrice completamente. Medicamentos  CenterPoint Energyome los medicamentos de venta libre y los recetados solamente como se lo haya indicado el mdico.  Si le recetaron un antibitico, tmelo como se lo haya indicado el mdico. No interrumpa la administracin del antibitico hasta que lo haya terminado. Conducir  No conduzca ni opere maquinaria pesada mientras toma analgsicos recetados.  No conduzca durante 24horas si le administraron un sedante. Estilo de vida  No beba alcohol. Esto es de suma importancia si est amamantando o toma analgsicos.  No consuma productos que contengan tabaco, incluidos cigarrillos, tabaco de Theatre managermascar o cigarrillos electrnicos. Si necesita ayuda para dejar de fumar, consulte al mdico. El tabaco puede retrasar la cicatrizacin. Qu debe comer y beber  Beba al menos 8vasos de ochoonzas (240cc) de agua todos los [address]  [address] a menos que el mdico le indique lo contrario. Si amamanta, quiz deba beber an ms cantidad de agua.  Coma alimentos ricos en Enbridge Energyfibras todos los das. Estos alimentos pueden ayudarla a prevenir o Educational psychologistaliviar el estreimiento. Los alimentos ricos en fibras incluyen, entre otros: ? Panes y cereales integrales. ? Arroz integral. ? Armed forces operational officerrijoles. ? Nils PyleFrutas y verduras frescas. Actividad  Retome sus actividades normales como se lo haya indicado el mdico. Pregntele al mdico qu actividades son seguras para usted.  Descanse todo lo que pueda. Trate de descansar o tomar una siesta mientras el beb duerme.  No levante objetos que pesen ms que su beb o ms de 10 libras (4,5 kg), como se lo haya indicado el mdico.  Pregntele al mdico cundo puede retomar la actividad sexual. Esto puede depender de  lo siguiente: ? Riesgo de sufrir una infeccin. ? Velocidad de cicatrizacin. ? Comodidad y deseo de [employer] sexual. Baarse  No tome baos de inmersin, no nade ni use el jacuzzi  hasta que el mdico lo autorice. Pregntele al mdico si puede ducharse. Delle Reining solo le permitan darse baos de esponja hasta que la incisin se cure.  Mantenga el vendaje seco, como se lo haya indicado el mdico. Instrucciones generales  No use tampones ni se haga duchas vaginales hasta que el mdico la autorice.  Use lo siguiente: ? Ropa cmoda y suelta. ? Un sostn firme y [place].  Controle la sangre que elimina por la vagina para detectar cogulos de [place]. Estos pueden tener el aspecto de grumos de color rojo oscuro, o secrecin marrn o negra.  Mantenga el perineo limpio y seco, como se lo haya indicado el mdico.  Cuando vaya al bao, siempre higiencese de adelante hacia atrs.  Si es posible, pdale a alguien que la ayude a cuidar de su beb y con las tareas del hogar durante [employer] despus de que le den el alta del hospital.  Chauncy Passy a todas las visitas de seguimiento para usted y el beb, como se lo haya indicado el mdico. Esto es importante. Comunquese con un mdico si:  Tiene los siguientes sntomas: ? Una secrecin vaginal con mal olor. ? Dificultad para orinar. ? Dolor al ConocoPhillips. ? Aumento o disminucin repentinos de la frecuencia de las deposiciones. ? Aumento del enrojecimiento, la hinchazn o el dolor alrededor de la incisin. ? Aumento del lquido o de la sangre que sale de la incisin. ? Pus o mal olor en Immunologist de la incisin. ? Fiebre. ? Erupcin cutnea. ? Poco inters o falta de inters en actividades que solan gustarle. ? Dudas sobre su cuidado y el del beb. ? Nuseas.  La incisin est caliente al tacto.  Siente dolor en las mamas y se ponen rojas o duras.  Siente tristeza o preocupacin de forma inusual.  Vomita.  Elimina cogulos de sangre grandes por la vagina. Si expulsa un cogulo de sangre, gurdelo para mostrrselo al [employer]. No tire la cadena sin mostrarle los cogulos de sangre a su mdico.  Orina ms de lo  habitual.  Se siente mareada o se desmaya.  No ha amamantado y no ha tenido un perodo menstrual durante 12 semanas despus del [place].  Dej de amamantar al beb y no ha tenido su perodo menstrual durante 12 semanas despus de haber dejado de Museum/gallery exhibitions officer. Solicite ayuda de inmediato si:  Tiene los siguientes sntomas: ? Dolor que no desaparece o no mejora con medicamentos. ? Journalist, newspaper. ? Dificultad para respirar. ? Visin borrosa o Nurse, adult. ? Pensamientos de autolesionarse o lesionar al beb. ? Air cabin crew abdomen o en una de las piernas. ? Dolor de cabeza intenso.  Se desmaya.  Tiene una hemorragia tan intensa de la vagina que [employer] compresas higinicas en [place]ianne Fick. Esta informacin no tiene Theme park manager el consejo del mdico. Asegrese de hacerle al mdico cualquier pregunta que tenga. Document Released: 01/11/2005 Document Revised: 05/03/2016 Document Reviewed: 12/16/2014 Elsevier Interactive Patient Education  [employer].

## 2017-10-13 NOTE — Discharge Summary (Signed)
OB Discharge Summary     Patient Name: Abigail Salazar DOB: 03-12-1990 MRN: [id number]  Date of admission: 10/11/2017 Delivering MD: Levie Heritage   Date of discharge: 10/13/2017  Admitting diagnosis: RCS Intrauterine pregnancy: [redacted]w[redacted]d     Secondary diagnosis:  Active Problems:   Supervision of high risk pregnancy, antepartum   Limited prenatal care in second trimester   History of cesarean delivery, currently pregnant   Language barrier   GBS (group B Streptococcus carrier), +RV culture, currently pregnant   Status post repeat low transverse cesarean section  Additional problems: none     Discharge diagnosis: Term Pregnancy Delivered and Anemia                                                                                                Post partum procedures:none  Augmentation: N/A  Complications: None  Hospital course:  Scheduled C/S   27 y.o. yo G2P2002 at [redacted]w[redacted]d was admitted to the hospital 10/11/2017 for scheduled cesarean section with the following indication:Elective Repeat.  Membrane Rupture Time/Date: 9:40 AM ,10/11/2017   Patient delivered a Viable infant.10/11/2017  Details of operation can be found in separate operative note.  Pateint had an uncomplicated postpartum course. She was started on iron on POD#1 due to Hgb 8.8 from 11.4, but she denies dizziness with ambulation or s/s anemia. She is tolerating a regular diet, passing flatus, and urinating well. Patient is discharged home in stable condition on  10/13/17. Pt questions travelling to [place] in 3 days to stay for approx 1 month. Rev'd with pt the concerns with this trip; could not give my blessing on this decision. Pt understands. Visit conducted with Eda, Spanish Interpreter.         Physical exam  Vitals:   10/12/17 0900 10/12/17 1430 10/12/17 2245 10/13/17 0500  BP: (!) 88/65 (!) 83/55 (!) 94/55 96/61  Pulse: 67 63 76 67  Resp: 16 16 (!) 1 20  Temp: 98.6 F (37 C) 98.4 F (36.9 C) 98.2 F (36.8  C) 98 F (36.7 C)  TempSrc: Oral Oral Oral Oral  SpO2: 98%  99%   Weight:      Height:       General: alert and cooperative Lochia: appropriate Uterine Fundus: firm Incision: honeycomb intact, dry DVT Evaluation: No evidence of DVT seen on physical exam. Labs: Lab Results  Component Value Date   WBC 10.3 10/12/2017   HGB 8.8 (L) 10/12/2017   HCT 25.8 (L) 10/12/2017   MCV 89.3 10/12/2017   PLT 147 (L) 10/12/2017   CMP Latest Ref Rng & Units 10/12/2017  Creatinine 0.44 - 1.00 mg/dL 4.09    Discharge instruction: per After Visit Summary and "Baby and Me Booklet".  After visit meds:  Allergies as of 10/13/2017   No Known Allergies     Medication List    STOP taking these medications   acetaminophen 325 MG tablet Commonly known as:  TYLENOL     TAKE these medications   ferrous sulfate 325 (65 FE) MG tablet Take 1 tablet (325 mg total) by mouth 2 (two) times  daily with a meal.   ibuprofen 600 MG tablet Commonly known as:  ADVIL,MOTRIN Take 1 tablet (600 mg total) by mouth every 6 (six) hours as needed.   oxyCODONE 5 MG immediate release tablet Commonly known as:  Oxy IR/ROXICODONE Take 1 tablet (5 mg total) by mouth every 4 (four) hours as needed (pain scale 4-7).   prenatal multivitamin Tabs tablet Take 1 tablet by mouth daily at 12 noon.       Diet: routine diet  Activity: Advance as tolerated. Pelvic rest for 6 weeks.   Outpatient follow up:1wk for incision check; 4wks for PP visit Follow up Appt: Future Appointments  Date Time Provider Department Center  10/26/2017  2:30 PM [facility] NURSE [facility]  11/21/2017  4:20 PM Arvilla MarketWallace, Catherine Lauren, DO [facility]   Follow up Visit:No follow-ups on file.  Postpartum contraception: Undecided  Newborn Data: Live born female  Birth Weight: 7 lb 12.9 oz (3540 g) APGAR: 9, 9  Newborn Delivery   Birth date/time:  10/11/2017 09:41:00 Delivery type:       Baby Feeding: Bottle and  Breast Disposition:home with mother   10/13/2017 Cam HaiSHAW, Jerrica Thorman, CNM  10:12 AM

## 2017-10-13 NOTE — Progress Notes (Signed)
Due to high hospital census and acuity, CSW is unable to meet with MOB to complete assessment for Edinburgh score of 10. PPD education will be provided to [facility] during discharge teaching by bedside nurse or ADT nurse. Please consult CSW if concerns arise or by MOB's request.  Blaine HamperAngel Boyd-Gilyard, MSW, LCSW Clinical Social Work [phone]

## 2017-10-13 NOTE — Lactation Note (Signed)
This note was copied from a baby's chart. Lactation Consultation Note  Patient Name: Abigail Salazar ZOXWR'UToday's Date: 10/13/2017   Baby 56 hours old. Bonnye FavaViria Interpreter present for [place].  Mother mostly formula fed last night stating she"did not had enough milk". Provided education on how breastmilk comes to volume and cluster feeding. Discussed supply and demand. Mother recently pumped 30 ml.  Reviewed milk storage. No other questions or concerns.  Reviewed engorgement care and monitoring voids/stools.    Maternal Data    Feeding Feeding Type: Breast Milk Nipple Type: Slow - flow  LATCH Score                   Interventions    Lactation Tools Discussed/Used     Consult Status      Hardie PulleyBerkelhammer, Ruth Boschen 10/13/2017, 5:50 PM

## 2017-10-17 ENCOUNTER — Encounter (HOSPITAL_COMMUNITY): Payer: Self-pay | Admitting: *Deleted

## 2017-10-26 ENCOUNTER — Ambulatory Visit: Payer: Self-pay

## 2017-11-01 ENCOUNTER — Ambulatory Visit: Attending: Family | Primary: Family Medicine

## 2017-11-01 ENCOUNTER — Ambulatory Visit
Admit: 2017-11-01 | Discharge: 2017-11-01 | Payer: PRIVATE HEALTH INSURANCE | Attending: Family | Primary: Family Medicine

## 2017-11-01 NOTE — Progress Notes (Signed)
Assessment/Plan:       ICD-10-CM ICD-9-CM    1. Postpartum care and examination Z39.2 V24.2      No nurse discharge needed.  She will continue to take ferrous sulfate as prescribed by the hospital, 325 mg ferrous sulfate.  No Pharmacies Listed    [facility]  Subjective:     Chief Complaint   Patient presents with   ??? Headache     & fever. x2 days   ??? Other     Wants doctor to check c-section scar.     Katherine Patterson is a 27 y.o. OTHER female.   HPI: Gave birth:22 days ago.  [place]  C-section was planned.  She denies pregnancy health comorbidities.   She felt well but had burning sensation of the wound.  Last felt fever 2 days ago and 1 day ago.  She felt feverish and chills.  She took Advil.  I looked this up and it is OK to take with breastfeeding.  Will need 6 week follow up for ob which is 3 weeks from now.    She  has no past medical history on file.   Review of Systems: Negative for: fever, chest pain, shortness of breath, leg swelling, exertional dyspnea, palpitations.  Current Medications:   No current outpatient medications on file prior to visit.     No current facility-administered medications on file prior to visit.      Social History: She  reports that she has never smoked. She has never used smokeless tobacco. She reports that she does not drink alcohol or use drugs.  Objective:     Vitals:    11/01/17 1137   BP: 107/68   Pulse: 64   Temp: 98.4 ??F (36.9 ??C)   TempSrc: Temporal   Weight: 129 lb 12.8 oz (58.9 kg)   Height: 4' 11.06" (1.5 m)    No LMP recorded. (Menstrual status: Breastfeeding).   Wt Readings from Last 2 Encounters:   11/01/17 129 lb 12.8 oz (58.9 kg)     No results found for any visits on 11/01/17.   Physical Examination: Integumentary well approximated, no evidence of infection.  General appearance - well developed, no acute distress.   Chest - clear to auscultation.  Heart - regular rate and rhythm without murmurs, rubs, or  gallops.  Abdomen - bowel sounds present x 4, NT, ND.  Extremities - pulses intact.  No peripheral edema.  Assessment/Plan:   Diagnoses and all orders for this visit:    1. Postpartum care and examination      [facility] [place] [facility]  Kendrick RanchLisa T. Favor Hackler, DNP, FNP-BC, BC-ADM  Tawana ScalePablina Alejandro Patterson expressed understanding of this plan.

## 2017-11-01 NOTE — Progress Notes (Signed)
Assessment/Plan:       ICD-10-CM ICD-9-CM    1. Postpartum care and examination Z39.2 V24.2      No nurse discharge needed.  She will continue to take ferrous sulfate as prescribed by the hospital, 325 mg ferrous sulfate.  No Pharmacies Listed    [facility]  Subjective:     Chief Complaint   Patient presents with   ??? Headache     & fever. x2 days   ??? Other     Wants doctor to check c-section scar.     Katherine Patterson is a 27 y.o. OTHER female.   HPI: Gave birth:22 days ago[place]  C-section was planned.  She denies pregnancy health comorbidities.   She felt well but had burning sensation of the wound.  Last felt fever 2 days ago and 1 day ago.  She felt feverish and chills.  She took Advil.  I looked this up and it is OK to take with breastfeeding.  Will need 6 week follow up for ob which is 3 weeks from now.    She  has no past medical history on file.   Review of Systems: Negative for: fever, chest pain, shortness of breath, leg swelling, exertional dyspnea, palpitations.  Current Medications:   No current outpatient medications on file prior to visit.     No current facility-administered medications on file prior to visit.      Social History: She  reports that she has never smoked. She has never used smokeless tobacco. She reports that she does not drink alcohol or use drugs.  Objective:     Vitals:    11/01/17 1137   BP: 107/68   Pulse: 64   Temp: 98.4 ??F (36.9 ??C)   TempSrc: Temporal   Weight: 129 lb 12.8 oz (58.9 kg)   Height: 4' 11.06" (1.5 m)    No LMP recorded. (Menstrual status: Breastfeeding).   Wt Readings from Last 2 Encounters:   11/01/17 129 lb 12.8 oz (58.9 kg)     No results found for any visits on 11/01/17.   Physical Examination: Integumentary well approximated, no evidence of infection.  General appearance - well developed, no acute distress.   Chest - clear to auscultation.  Heart - regular rate and rhythm without murmurs, rubs, or gallops.   Abdomen - bowel sounds present x 4, NT, ND.  Extremities - pulses intact.  No peripheral edema.  Assessment/Plan:   Diagnoses and all orders for this visit:    1. Postpartum care and examination      [facility] [place] [facility]  Kendrick RanchLisa T. Malyah Ohlrich, DNP, FNP-BC, BC-ADM  Katherine ScalePablina Alejandro Patterson expressed understanding of this plan.

## 2017-11-21 ENCOUNTER — Encounter: Payer: Self-pay | Admitting: Internal Medicine

## 2017-11-21 ENCOUNTER — Ambulatory Visit (INDEPENDENT_AMBULATORY_CARE_PROVIDER_SITE_OTHER): Payer: Self-pay | Admitting: Internal Medicine

## 2017-11-21 DIAGNOSIS — Z30011 Encounter for initial prescription of contraceptive pills: Secondary | ICD-10-CM

## 2017-11-21 MED ORDER — NORETHINDRONE 0.35 MG PO TABS: 1.0000 | ORAL_TABLET | Freq: Every day | ORAL | 3 refills | Status: DC

## 2017-11-21 NOTE — Progress Notes (Signed)
Subjective:     Abigail Salazar is a 27 y.o. female who presents for a postpartum visit. She is 5 weeks postpartum following a low cervical transverse Cesarean section. I have fully reviewed the prenatal and intrapartum course. The delivery was at [redacted]w[redacted]d gestational weeks. Outcome: repeat cesarean section, low transverse incision. Anesthesia: spinal. Postpartum course has been benign. Baby's course has been unremarkable. Baby is feeding by both breast and bottle - Similac with Iron. Bleeding no bleeding. Bowel function is normal. Bladder function is normal. Patient is not sexually active. Contraception method is oral progesterone-only contraceptive. Postpartum depression screening: negative.  The following portions of the patient's history were reviewed and updated as appropriate: allergies, current medications, past family history, past medical history, past social history, past surgical history and problem list.  Review of Systems Pertinent items are noted in HPI.   Objective:    BP 108/75   Pulse 67   Wt 132 lb 3.2 oz (60 kg)   Breastfeeding? Yes   BMI 26.70 kg/m   General:  alert and cooperative  Lungs: clear to auscultation bilaterally  Heart:  regular rate and rhythm, S1, S2 normal, no murmur, click, rub or gallop  Abdomen: soft, non-tender; bowel sounds normal; no masses,  no organomegaly and incision healing well without drainage         Assessment:     Benign postpartum exam. Pap smear not done at today's visit, normal PAP on 07/29/17.   Plan:    1. Contraception: oral progesterone-only contraceptive 2. Follow up in: 1 year for annual exam or as needed.    Marcy Siren, D.O. [facility] Fellow, [facility], [facility] 11/21/2017, 6:42 PM

## 2017-11-21 NOTE — Patient Instructions (Signed)
I have prescribed the mini pill for contraception. This will not effect your milk supply or increase risk of blood clots. Take it at the same time every day to prevent pregnancy and irregular bleeding. Use condoms for the first two weeks of taking the pill.

## 2018-01-31 ENCOUNTER — Telehealth: Payer: Self-pay | Admitting: *Deleted

## 2018-01-31 MED ORDER — NORETHINDRONE 0.35 MG PO TABS: 1.0000 | ORAL_TABLET | Freq: Every day | ORAL | 11 refills | Status: AC

## 2018-01-31 NOTE — Telephone Encounter (Signed)
Call received from pt's significant other. He stated that he is at the pharmacy to pick up Rx for birth control which was prescribed in October 2019 and the pharmacy has not received the Rx. Per chart review, receipt of Rx confirmed on 11/21/17. Pt's significant other states that pt is still breastfeeding and they have not had sex since baby's birth. Pt has not previously obtained the Rx due to not having the money. I called and spoke with pharmacist @ [facility] who confirmed no record of Rx on file. I provided Rx order by phone. Pt's significant other was advised to use condoms for the first month of pills. Once Abigail Salazar has finished the first pack and had a period, condoms will not be necessary. He voiced understanding.

## 2018-03-07 ENCOUNTER — Encounter (HOSPITAL_COMMUNITY): Payer: Self-pay | Admitting: Emergency Medicine

## 2018-03-07 ENCOUNTER — Emergency Department (HOSPITAL_COMMUNITY)
Admission: EM | Admit: 2018-03-07 | Discharge: 2018-03-07 | Disposition: A | Discharge status: Home / Self Care | Payer: Self-pay | Attending: Emergency Medicine | Admitting: Emergency Medicine

## 2018-03-07 DIAGNOSIS — I10 Essential (primary) hypertension: Secondary | ICD-10-CM | POA: Insufficient documentation

## 2018-03-07 DIAGNOSIS — L600 Ingrowing nail: Secondary | ICD-10-CM | POA: Insufficient documentation

## 2018-03-07 DIAGNOSIS — Z79899 Other long term (current) drug therapy: Secondary | ICD-10-CM | POA: Insufficient documentation

## 2018-03-07 MED ORDER — LIDOCAINE HCL 2 % IJ SOLN
20.0000 mL | Freq: Once | INTRAMUSCULAR | Status: AC
  Administered 2018-03-07: 400 mg
  Filled 2018-03-07: qty 20

## 2018-03-07 MED ORDER — CEPHALEXIN 500 MG PO CAPS: 500.0000 mg | ORAL_CAPSULE | Freq: Four times a day (QID) | ORAL | 0 refills | Status: AC

## 2018-03-07 NOTE — ED Notes (Signed)
Spanish interpretor at bedside

## 2018-03-07 NOTE — ED Provider Notes (Signed)
[facility] EMERGENCY DEPARTMENT Provider Note   CSN: [id number] Arrival date & time: 03/07/18  1046   History   Chief Complaint Chief Complaint  Patient presents with  . Toe Pain    HPI Abigail Salazar is a 28 y.o. female.  HPI in-house Spanish interpreter at bedside  69 YOF presents today with complaints of left great toe pain.  She notes that approximately 1 month ago she developed pain to the left first lateral nail fold.  She notes she was seen in the clinic and put on amoxicillin which she has been taking for the last week.  She notes this did not make any significant changes any pain and swelling along the foot.  She denies any fever.  No trauma to the foot.  Past Medical History:  Diagnosis Date  . Hypertension    chronic HTN prior to pregnancy. - no meds    Patient Active Problem List   Diagnosis Date Noted  . Status post repeat low transverse cesarean section 10/11/2017  . Language barrier 08/12/2017    Past Surgical History:  Procedure Laterality Date  . CESAREAN SECTION     classical incision unsure of internal incision   . CESAREAN SECTION N/A 10/11/2017   Procedure: REPEAT CESAREAN SECTION;  Surgeon: Levie Heritage, DO;  Location: [facility];  Service: Obstetrics;  Laterality: N/A;     OB History    Gravida  2   Para  2   Term  2   Preterm      AB      Living  2     SAB      TAB      Ectopic      Multiple  0   Live Births  2            Home Medications    Prior to Admission medications   Medication Sig Start Date End Date Taking? Authorizing Provider  cephALEXin (KEFLEX) 500 MG capsule Take 1 capsule (500 mg total) by mouth 4 (four) times daily. 03/07/18   Wilhelmina Hark, Tinnie Gens, PA-C  ferrous sulfate 325 (65 FE) MG tablet Take 1 tablet (325 mg total) by mouth 2 (two) times daily with a meal. 10/13/17   Arabella Merles, CNM  ibuprofen (ADVIL,MOTRIN) 600 MG tablet Take 1 tablet (600 mg total) by mouth  every 6 (six) hours as needed. 10/13/17   Arabella Merles, CNM  norethindrone (MICRONOR,CAMILA,ERRIN) 0.35 MG tablet Take 1 tablet (0.35 mg total) by mouth daily. 01/31/18   Reva Bores, MD  oxyCODONE (OXY IR/ROXICODONE) 5 MG immediate release tablet Take 1 tablet (5 mg total) by mouth every 4 (four) hours as needed (pain scale 4-7). 10/13/17   Arabella Merles, CNM  Prenatal Vit-Fe Fumarate-FA (PRENATAL MULTIVITAMIN) TABS tablet Take 1 tablet by mouth daily at 12 noon.    [provider]    Family History Family History  Problem Relation Age of Onset  . Arthritis Father   . Hypertension Father     Social History Social History   Tobacco Use  . Smoking status: Never Smoker  . Smokeless tobacco: Never Used  Substance Use Topics  . Alcohol use: Not Currently  . Drug use: Not Currently     Allergies   Patient has no known allergies.   Review of Systems Review of Systems  All other systems reviewed and are negative.    Physical Exam Updated Vital Signs BP 115/63 (BP Location: Right  Arm)   Pulse 85   Temp 98.3 F (36.8 C) (Oral)   Resp 20   LMP 02/10/2018   SpO2 98%   Physical Exam Vitals signs and nursing note reviewed.  Constitutional:      Appearance: She is well-developed.  HENT:     Head: Normocephalic and atraumatic.  Eyes:     General: No scleral icterus.       Right eye: No discharge.        Left eye: No discharge.     Conjunctiva/sclera: Conjunctivae normal.     Pupils: Pupils are equal, round, and reactive to light.  Neck:     Musculoskeletal: Normal range of motion.     Vascular: No JVD.     Trachea: No tracheal deviation.  Pulmonary:     Effort: Pulmonary effort is normal.     Breath sounds: No stridor.  Neurological:     Mental Status: She is alert and oriented to person, place, and time.     Coordination: Coordination normal.  Psychiatric:        Behavior: Behavior normal.        Thought Content: Thought content normal.         Judgment: Judgment normal.      ED Treatments / Results  Labs (all labs ordered are listed, but only abnormal results are displayed) Labs Reviewed - No data to display  EKG None  Radiology No results found.  Procedures Procedures (including critical care time)  NERVE BLOCK Performed by: Kelle DartingJeffrey Todd Delecia Vastine Consent: Verbal consent obtained. Required items: required blood products, implants, devices, and special equipment available Time out: Immediately prior to procedure a "time out" was called to verify the correct patient, procedure, equipment, support staff and site/side marked as required.  Indication: ingrown toenail Nerve block body site: left great toe  Preparation: Patient was prepped and draped in the usual sterile fashion. Needle gauge: 24 G Location technique: anatomical landmarks  Local anesthetic: lidocaine 2%   Anesthetic total: 4 ml  Outcome: pain improved Patient tolerance: Patient tolerated the procedure well with no immediate complications.   Medications Ordered in ED Medications  lidocaine (XYLOCAINE) 2 % (with pres) injection 400 mg (400 mg Infiltration Given by Other 03/07/18 1349)     Initial Impression / Assessment and Plan / ED Course  I have reviewed the triage vital signs and the nursing notes.  Pertinent labs & imaging results that were available during my care of the patient were reviewed by me and considered in my medical decision making (see chart for details).     Labs:   Imaging:  Consults:  Therapeutics: Lidocaine without epinephrine  Discharge Meds:   Assessment/Plan: 28 year old female presents today with ingrown toenail.  She has some chronic irritation along the lateral nail fold.  She has no signs of infection presently.  I performed a digital block and was able to lift the ingrown toe out beneath the skin, a small section of the nail was resected distally.  Patient tolerated procedure well.  She will continue wound  care at home.  If she develops any complications including infection she will initiate antibiotics and follow-up.  She will follow-up as an outpatient with podiatry for management if she continues to endorse discomfort with the toenail.  Patient verbalized understanding and agreement to today's plan.   Final Clinical Impressions(s) / ED Diagnoses   Final diagnoses:  Ingrowing toenail    ED Discharge Orders  Ordered    cephALEXin (KEFLEX) 500 MG capsule  4 times daily     03/07/18 1348           Eyvonne Mechanic, Cordelia Poche 03/07/18 1352    Wynetta Fines, MD 03/15/18 1450

## 2018-03-07 NOTE — ED Triage Notes (Addendum)
Pt states she has had an ingrown nail to her left great toe for 1 month. Swelling and redness to left toe noted. No allergies to meds. LMP 17th of Jan. Pt speaks spanish.

## 2018-03-07 NOTE — Discharge Instructions (Addendum)
Please read the attached information.  If he develop any signs of infection including redness, warmth, or discharge, initiate antibiotics.  If you continue to experience pain with this toenail please follow-up with the podiatrist Dr. Logan Bores for evaluation.  You may return to the emergency room at any point for any new or worsening signs or symptoms.

## 2018-11-10 ENCOUNTER — Inpatient Hospital Stay
Admit: 2018-11-10 | Discharge: 2018-11-10 | Disposition: A | Discharge status: Home / Self Care | Attending: Emergency Medicine

## 2018-11-10 DIAGNOSIS — J0100 Acute maxillary sinusitis, unspecified: Secondary | ICD-10-CM

## 2018-11-10 MED ORDER — CETIRIZINE 10 MG TAB
10 mg | ORAL_TABLET | Freq: Every day | ORAL | 0 refills | Status: AC
Start: 2018-11-10 — End: 2018-11-17

## 2018-11-10 MED ORDER — ACETAMINOPHEN 500 MG TAB
500 mg | Freq: Once | ORAL | Status: AC
Start: 2018-11-10 — End: 2018-11-10
  Administered 2018-11-10: 21:00:00 via ORAL

## 2018-11-10 MED ORDER — AMOXICILLIN CLAVULANATE 875 MG-125 MG TAB
875-125 mg | ORAL_TABLET | Freq: Two times a day (BID) | ORAL | 0 refills | Status: DC
Start: 2018-11-10 — End: 2020-08-26

## 2018-11-10 MED ORDER — FLUTICASONE 50 MCG/ACTUATION NASAL SPRAY, SUSP
50 mcg/actuation | Freq: Every day | NASAL | 0 refills | Status: AC
Start: 2018-11-10 — End: 2018-11-17

## 2018-11-10 MED FILL — ACETAMINOPHEN 500 MG TAB: 500 mg | ORAL | Qty: 2

## 2018-11-10 NOTE — ED Notes (Signed)
PA and RN reviewed discharge instructions and options with patient; patient verbalized understanding. Pt. Ambulated to exit without difficulty and in no signs of acute distress. Patient was counseled on medications prescribed at discharge and will follow up as discussed.

## 2018-11-10 NOTE — ED Notes (Signed)
Pt assessed by Irving Burton, PA in waiting room at this time. Provider does not want to call code stroke.

## 2018-11-10 NOTE — ED Provider Notes (Signed)
ED Provider Notes by Lowella Petties, PA at 11/10/18 1618                Author: Lowella Petties, PA  Service: --  Author Type: Physician Assistant       Filed: 11/10/18 1640  Date of Service: 11/10/18 1618  Status: Attested           Editor: Lowella Petties, PA (Physician Assistant)  Cosigner: Catalina Pizza, MD at 11/10/18 2334          Attestation signed by Catalina Pizza, MD at 11/10/18 2334          I was personally available for consultation in the emergency department.  I have reviewed the chart and agree with the documentation recorded by the [facility], including  the assessment, treatment plan, and disposition.   Catalina Pizza, MD                                    Patient is a 28 year old female with no significant past medical history presenting to emergency department for  evaluation of left-sided facial pain.  Patient reports that her symptoms began 1 month ago, resolved, and then returned 2 nights ago.  Pain is located in the left forehead, left cheek, and left ear.  She denies fever, chills, sweats, cough, nasal congestion,  sore throat, chest pain, shortness of breath, numbness, tingling, weakness, difficulty walking, or any other medical complaints at this time.  Denies known sick contacts.  Has been treating with Tylenol and ibuprofen without relief in symptoms.                No past medical history on file.      No past surgical history on file.        No family history on file.        Social History          Socioeconomic History         ?  Marital status:  LEGALLY SEPARATED              Spouse name:  Not on file         ?  Number of children:  Not on file     ?  Years of education:  Not on file     ?  Highest education level:  Not on file       Occupational History        ?  Not on file       Social Needs         ?  Financial resource strain:  Not on file        ?  Food insecurity              Worry:  Not on file         Inability:  Not on file        ?  Transportation needs               Medical:  Not on file         Non-medical:  Not on file       Tobacco Use         ?  Smoking status:  Never Smoker     ?  Smokeless tobacco:  Never Used       Substance and Sexual Activity         ?  Alcohol use:  Never              Frequency:  Never         ?  Drug use:  Never     ?  Sexual activity:  Not on file       Lifestyle        ?  Physical activity              Days per week:  Not on file         Minutes per session:  Not on file         ?  Stress:  Not on file       Relationships        ?  Social Engineer, manufacturing systemsconnections              Talks on phone:  Not on file         Gets together:  Not on file         Attends religious service:  Not on file         Active member of club or organization:  Not on file         Attends meetings of clubs or organizations:  Not on file         Relationship status:  Not on file        ?  Intimate partner violence              Fear of current or ex partner:  Not on file         Emotionally abused:  Not on file         Physically abused:  Not on file         Forced sexual activity:  Not on file        Other Topics  Concern        ?  Not on file       Social History Narrative        ?  Not on file              ALLERGIES: Patient has no known allergies.      Review of Systems    Constitutional: Negative for chills and fever.    HENT: Positive for ear pain (left) , sinus pressure (left)  and sinus pain (left) . Negative for hearing loss, rhinorrhea, sore throat and trouble swallowing.          Left sided facial pain    Eyes: Negative for visual disturbance.    Respiratory: Negative for cough and shortness of breath.     Cardiovascular: Negative for chest pain.    Gastrointestinal: Negative for abdominal pain, diarrhea, nausea and vomiting.    Genitourinary: Negative for flank pain.    Musculoskeletal: Negative for back pain.    Skin: Negative for color change.    Neurological: Negative for dizziness and headaches.    Psychiatric/Behavioral: Negative for confusion.            Vitals:           11/10/18 1546        BP:  101/65     Pulse:  66     Resp:  16     Temp:  99.1 ??F (37.3 ??C)     SpO2:  100%        Weight:  67.5 kg (148 lb 13 oz)  Physical Exam   Vitals signs and nursing note reviewed.   Constitutional:        General: She is not in acute distress.     Appearance: Normal appearance. She is not ill-appearing.    HENT:       Head: Normocephalic and atraumatic.      Jaw: There is normal jaw occlusion. No trismus or pain on movement.      Right Ear: Tympanic membrane, ear canal and external ear normal. Tympanic membrane is not perforated, erythematous or retracted.       Left Ear: Tympanic membrane, ear canal and external ear normal. Tympanic membrane is not perforated, erythematous or retracted.      Nose:      Right Sinus: No maxillary sinus tenderness or frontal sinus tenderness.      Left Sinus:  Maxillary sinus tenderness and frontal sinus tenderness present.      Mouth/Throat:       Pharynx: Oropharynx is clear. Uvula midline. No pharyngeal swelling, oropharyngeal exudate, posterior oropharyngeal erythema or uvula  swelling.      Tonsils: No tonsillar exudate or tonsillar abscesses.    Eyes:       General:         Right eye: No discharge.         Left eye: No discharge.      Extraocular Movements: Extraocular movements intact.      Conjunctiva/sclera: Conjunctivae normal.   Neck:       Musculoskeletal: Normal range of motion and neck supple. No neck rigidity.    Cardiovascular:       Rate and Rhythm: Normal rate and regular rhythm.   Pulmonary :       Effort: Pulmonary effort is normal.      Breath sounds: Normal breath sounds.   Abdominal :      General: There is no distension.      Palpations: Abdomen is soft.      Tenderness: There is no abdominal tenderness. There is no guarding.     Musculoskeletal: Normal range of motion.      Right lower leg: No edema.      Left lower leg: No edema.     Lymphadenopathy:       Cervical: No cervical adenopathy.   Skin :      General: Skin is  warm and dry.   Neurological :       General: No focal deficit present.      Mental Status: She is alert and oriented to person, place, and time.    Psychiatric:         Mood and Affect: Mood normal.              MDM   Number of Diagnoses or Management Options   Acute non-recurrent maxillary sinusitis:    Diagnosis management comments: Patient is alert, appearing, afebrile, vitals stable.  2-day history of left-sided sinus pressure and left ear pain, with similar symptoms 3 weeks ago which had resolved.   No clinical signs of otitis media or strep pharyngitis at this time.  No trismus or peritonsillar abscess.  Tenderness palpation along the frontal and maxillary left-sided sinuses.  And symptoms consistent with eustachian tube dysfunction versus bacterial  sinusitis, due to recent resolution and recurrence of symptoms, will treat with antibiotics at this time.  She is also given prescriptions for nasal spray and antihistamines.  Strict return precautions outlined for patient.  All questions answered at  this time. Discussed patient with ED attending Satira Anis, MD who agrees with current management plan.          Interpreter phone used throughout history and physical exam, patient is Spanish-speaking          Amount and/or Complexity of Data Reviewed   Discuss the patient with other providers: yes (ED attending Dr. Gordy Levan )         4:40 PM   Pt has been reevaluated. There are no new complaints, changes, or physical findings at this time. Medications have been reviewed w/ pt and/or family. Pt and/or family's questions have been answered.  Pt and/or family expressed good understanding of the dx/tx/rx and is in agreement with plan of care. Pt instructed and agreed to f/u w/ ENT and to return to ED upon further deterioration. Pt is ready for discharge.         IMPRESSION:      1.  Acute non-recurrent maxillary sinusitis            PLAN:   1.      Current Discharge Medication List              START taking these  medications          Details        amoxicillin-clavulanate (Augmentin) 875-125 mg per tablet  Take 1 Tab by mouth two (2) times a day for 7 days.   Qty: 14 Tab, Refills:  0               fluticasone propionate (FLONASE) 50 mcg/actuation nasal spray  2 Sprays by Both Nostrils route daily for 7 days.   Qty: 1 Bottle, Refills:  0               cetirizine (ZyrTEC) 10 mg tablet  Take 1 Tab by mouth daily for 7 days.   Qty: 7 Tab, Refills:  0                      2.      Follow-up Information               Follow up With  Specialties  Details  Why  Contact Info              Arletta Bale, MD  Otolaryngology  Go to   If symptoms do not improve with antibiotics  [address]   [place] [address]   [phone]                      Return to ED if worse                Procedures

## 2018-11-10 NOTE — ED Notes (Signed)
 Pt arrives with c/o intermittent left sided facial pain with pins and needles feeling x 1 month. Pt reports last feeling normal at 0300 today.

## 2018-11-10 NOTE — ED Notes (Signed)
Pt assessed by Emily, PA in waiting room at this time. Provider does not want to call code stroke.

## 2018-11-10 NOTE — ED Triage Notes (Signed)
Pt arrives with c/o intermittent left sided facial pain with pins and needles feeling x 1 month. Pt reports last feeling "normal" at 0300 today.

## 2018-11-10 NOTE — ED Provider Notes (Signed)
Patient is a 28 year old female with no significant past medical history presenting to emergency department for evaluation of left-sided facial pain.  Patient reports that her symptoms began 1 month ago, resolved, and then returned 2 nights ago.  Pain is located in the left forehead, left cheek, and left ear.  She denies fever, chills, sweats, cough, nasal congestion, sore throat, chest pain, shortness of breath, numbness, tingling, weakness, difficulty walking, or any other medical complaints at this time.  Denies known sick contacts.  Has been treating with Tylenol and ibuprofen without relief in symptoms.           No past medical history on file.    No past surgical history on file.      No family history on file.    Social History     Socioeconomic History   ??? Marital status: LEGALLY SEPARATED     Spouse name: Not on file   ??? Number of children: Not on file   ??? Years of education: Not on file   ??? Highest education level: Not on file   Occupational History   ??? Not on file   Social Needs   ??? Financial resource strain: Not on file   ??? Food insecurity     Worry: Not on file     Inability: Not on file   ??? Transportation needs     Medical: Not on file     Non-medical: Not on file   Tobacco Use   ??? Smoking status: Never Smoker   ??? Smokeless tobacco: Never Used   Substance and Sexual Activity   ??? Alcohol use: Never     Frequency: Never   ??? Drug use: Never   ??? Sexual activity: Not on file   Lifestyle   ??? Physical activity     Days per week: Not on file     Minutes per session: Not on file   ??? Stress: Not on file   Relationships   ??? Social Wellsite geologist on phone: Not on file     Gets together: Not on file     Attends religious service: Not on file     Active member of club or organization: Not on file     Attends meetings of clubs or organizations: Not on file     Relationship status: Not on file   ??? Intimate partner violence     Fear of current or ex partner: Not on file     Emotionally abused: Not on file      Physically abused: Not on file     Forced sexual activity: Not on file   Other Topics Concern   ??? Not on file   Social History Narrative   ??? Not on file         ALLERGIES: Patient has no known allergies.    Review of Systems   Constitutional: Negative for chills and fever.   HENT: Positive for ear pain (left), sinus pressure (left) and sinus pain (left). Negative for hearing loss, rhinorrhea, sore throat and trouble swallowing.         Left sided facial pain   Eyes: Negative for visual disturbance.   Respiratory: Negative for cough and shortness of breath.    Cardiovascular: Negative for chest pain.   Gastrointestinal: Negative for abdominal pain, diarrhea, nausea and vomiting.   Genitourinary: Negative for flank pain.   Musculoskeletal: Negative for back pain.   Skin: Negative for color change.  Neurological: Negative for dizziness and headaches.   Psychiatric/Behavioral: Negative for confusion.       Vitals:    11/10/18 1546   BP: 101/65   Pulse: 66   Resp: 16   Temp: 99.1 ??F (37.3 ??C)   SpO2: 100%   Weight: 67.5 kg (148 lb 13 oz)            Physical Exam  Vitals signs and nursing note reviewed.   Constitutional:       General: She is not in acute distress.     Appearance: Normal appearance. She is not ill-appearing.   HENT:      Head: Normocephalic and atraumatic.      Jaw: There is normal jaw occlusion. No trismus or pain on movement.      Right Ear: Tympanic membrane, ear canal and external ear normal. Tympanic membrane is not perforated, erythematous or retracted.      Left Ear: Tympanic membrane, ear canal and external ear normal. Tympanic membrane is not perforated, erythematous or retracted.      Nose:      Right Sinus: No maxillary sinus tenderness or frontal sinus tenderness.      Left Sinus: Maxillary sinus tenderness and frontal sinus tenderness present.      Mouth/Throat:      Pharynx: Oropharynx is clear. Uvula midline. No pharyngeal swelling,  oropharyngeal exudate, posterior oropharyngeal erythema or uvula swelling.      Tonsils: No tonsillar exudate or tonsillar abscesses.   Eyes:      General:         Right eye: No discharge.         Left eye: No discharge.      Extraocular Movements: Extraocular movements intact.      Conjunctiva/sclera: Conjunctivae normal.   Neck:      Musculoskeletal: Normal range of motion and neck supple. No neck rigidity.   Cardiovascular:      Rate and Rhythm: Normal rate and regular rhythm.   Pulmonary:      Effort: Pulmonary effort is normal.      Breath sounds: Normal breath sounds.   Abdominal:      General: There is no distension.      Palpations: Abdomen is soft.      Tenderness: There is no abdominal tenderness. There is no guarding.   Musculoskeletal: Normal range of motion.      Right lower leg: No edema.      Left lower leg: No edema.   Lymphadenopathy:      Cervical: No cervical adenopathy.   Skin:     General: Skin is warm and dry.   Neurological:      General: No focal deficit present.      Mental Status: She is alert and oriented to person, place, and time.   Psychiatric:         Mood and Affect: Mood normal.          MDM  Number of Diagnoses or Management Options  Acute non-recurrent maxillary sinusitis:   Diagnosis management comments: Patient is alert, appearing, afebrile, vitals stable.  2-day history of left-sided sinus pressure and left ear pain, with similar symptoms 3 weeks ago which had resolved.  No clinical signs of otitis media or strep pharyngitis at this time.  No trismus or peritonsillar abscess.  Tenderness palpation along the frontal and maxillary left-sided sinuses.  And symptoms consistent with eustachian tube dysfunction versus bacterial sinusitis, due to recent resolution and recurrence of  symptoms, will treat with antibiotics at this time.  She is also given prescriptions for nasal spray and antihistamines.  Strict return precautions outlined for patient.  All questions answered at this  time. Discussed patient with ED attending Catalina PizzaGill, Richard R, MD who agrees with current management plan.       Interpreter phone used throughout history and physical exam, patient is Spanish-speaking       Amount and/or Complexity of Data Reviewed  Discuss the patient with other providers: yes (ED attending Dr. Delane GingerGill )      4:40 PM  Pt has been reevaluated. There are no new complaints, changes, or physical findings at this time. Medications have been reviewed w/ pt and/or family. Pt and/or family's questions have been answered. Pt and/or family expressed good understanding of the dx/tx/rx and is in agreement with plan of care. Pt instructed and agreed to f/u w/ ENT and to return to ED upon further deterioration. Pt is ready for discharge.      IMPRESSION:  1. Acute non-recurrent maxillary sinusitis        PLAN:  1.   Current Discharge Medication List      START taking these medications    Details   amoxicillin-clavulanate (Augmentin) 875-125 mg per tablet Take 1 Tab by mouth two (2) times a day for 7 days.  Qty: 14 Tab, Refills: 0      fluticasone propionate (FLONASE) 50 mcg/actuation nasal spray 2 Sprays by Both Nostrils route daily for 7 days.  Qty: 1 Bottle, Refills: 0      cetirizine (ZyrTEC) 10 mg tablet Take 1 Tab by mouth daily for 7 days.  Qty: 7 Tab, Refills: 0           2.   Follow-up Information     Follow up With Specialties Details Why Contact Info    Paulla DollyGibbons, Patrick, MD Otolaryngology Go to  If symptoms do not improve with antibiotics [address]  [address]  [place] [id number]  [phone]              Return to ED if worse            Procedures

## 2019-07-18 ENCOUNTER — Inpatient Hospital Stay
Admit: 2019-07-18 | Discharge: 2019-07-18 | Disposition: A | Discharge status: Home / Self Care | Attending: Emergency Medicine

## 2019-07-18 DIAGNOSIS — K0889 Other specified disorders of teeth and supporting structures: Secondary | ICD-10-CM

## 2019-07-18 NOTE — ED Provider Notes (Signed)
ED Provider Notes by Alwyn Ren, PA at 07/18/19 1908                Author: Alwyn Ren, PA  Service: --  Author Type: Physician Assistant       Filed: 07/18/19 2356  Date of Service: 07/18/19 1908  Status: Attested           Editor: Nelsy Madonna, Lyman Speller, PA (Physician Assistant)  Cosigner: Terrall Laity, MD at 07/24/19 1201          Attestation signed by Terrall Laity, MD at 07/24/19 1201          I was personally available for consultation in the emergency department.  I have reviewed the chart and agree with the documentation recorded by the [facility], including  the assessment, treatment plan, and disposition.   Terrall Laity, MD                                    Please note that this dictation was completed with Dragon, the computer voice recognition software. ??Quite often  unanticipated grammatical, syntax, homophones, and other interpretive errors are inadvertently transcribed by the computer software. ??Please disregard these errors. ??Please excuse any errors that have escaped final proofreading.      Patient is a 29 year old otherwise healthy female presenting to emergency department for evaluation of left-sided jaw pain that has been present for the past 8 days.  She states that the pain is radiating to her left ear and she also feels some pain in  her teeth.  She denies any injury or trauma to the face.  States that this happened several months ago when she was diagnosed with an ear infection.  She denies fever, chills, facial swelling, skin changes, trouble swallowing, neck stiffness, or any other  medical complaints at this time.                No past medical history on file.      No past surgical history on file.        No family history on file.        Social History          Socioeconomic History         ?  Marital status:  LEGALLY SEPARATED              Spouse name:  Not on file         ?  Number of children:  Not on file     ?  Years of education:  Not on file     ?  Highest  education level:  Not on file       Occupational History        ?  Not on file       Tobacco Use         ?  Smoking status:  Never Smoker     ?  Smokeless tobacco:  Never Used       Substance and Sexual Activity         ?  Alcohol use:  Never     ?  Drug use:  Never     ?  Sexual activity:  Not on file        Other Topics  Concern        ?  Not on file  Social History Narrative        ?  Not on file          Social Determinants of Health          Financial Resource Strain:         ?  Difficulty of Paying Living Expenses:        Food Insecurity:         ?  Worried About Programme researcher, broadcasting/film/video in the Last Year:      ?  Barista in the Last Year:        Transportation Needs:         ?  Freight forwarder (Medical):      ?  Lack of Transportation (Non-Medical):        Physical Activity:         ?  Days of Exercise per Week:      ?  Minutes of Exercise per Session:        Stress:         ?  Feeling of Stress :        Social Connections:         ?  Frequency of Communication with Friends and Family:      ?  Frequency of Social Gatherings with Friends and Family:      ?  Attends Religious Services:      ?  Active Member of Clubs or Organizations:      ?  Attends Banker Meetings:      ?  Marital Status:        Intimate Partner Violence:         ?  Fear of Current or Ex-Partner:      ?  Emotionally Abused:      ?  Physically Abused:         ?  Sexually Abused:               ALLERGIES: Patient has no known allergies.      Review of Systems    Constitutional: Negative for chills and fever.    HENT: Positive for dental problem. Negative for ear pain and sore throat.     Eyes: Negative for visual disturbance.    Respiratory: Negative for cough and shortness of breath.     Cardiovascular: Negative for chest pain.    Gastrointestinal: Negative for abdominal pain.    Genitourinary: Negative for flank pain.    Musculoskeletal: Negative for back pain.    Skin: Negative for color change.    Neurological:  Negative for dizziness and headaches.    Psychiatric/Behavioral: Negative for confusion.            Vitals:          07/18/19 1816        BP:  103/71     Pulse:  71     Resp:  18     Temp:  97.3 ??F (36.3 ??C)     SpO2:  99%        Weight:  65.8 kg (145 lb)                Physical Exam   Vitals and nursing note reviewed.   Constitutional:        General: She is not in acute distress.     Appearance: Normal appearance. She is not ill-appearing.    HENT:  Head: Normocephalic and atraumatic.      Jaw: There is normal jaw occlusion. Tenderness  (left sided) present. No trismus, swelling, pain on movement or malocclusion.      Comments: Tenderness over TMJ      Right Ear: Tympanic membrane, ear canal and external ear normal. Tympanic membrane is not erythematous.      Left Ear: Tympanic membrane, ear canal and external ear normal. Tympanic membrane is not erythematous.      Mouth/Throat:       Dentition: No dental tenderness, gingival swelling, dental caries or dental abscesses.      Pharynx: Oropharynx is clear.    Eyes:       Extraocular Movements: Extraocular movements intact.      Conjunctiva/sclera: Conjunctivae normal.   Cardiovascular:       Rate and Rhythm: Normal rate and regular rhythm.   Pulmonary :       Effort: Pulmonary effort is normal.      Breath sounds: Normal breath sounds.   Abdominal :      Palpations: Abdomen is soft.      Tenderness: There is no abdominal tenderness.     Musculoskeletal:          General: Normal range of motion.      Cervical back: No rigidity.    Skin:      General: Skin is warm and dry.   Neurological :       General: No focal deficit present.      Mental Status: She is alert and oriented to person, place, and time.    Psychiatric:         Mood and Affect: Mood normal.              MDM   Number of Diagnoses or Management Options   Jaw pain   Pain, dental   Diagnosis management comments: Patient is alert, well-appearing, afebrile, vitals stable.  8 days of atraumatic left sided  jaw and dental pain.  No evidence of ear infection.  No findings of gingival  swelling, tenderness, or dental abscess.  No facial swelling or cellulitis.  Pain over the TMJ.  Advised patient that there are no current infectious findings, however her pain may be secondary to either dental caries or pain or TMJ.  Advised patient  of likely differential diagnosis.  She is discharged home with outpatient follow-up with dentistry as well as ENT.  Return precautions outlined.  Questions answered at this time.          7:08 PM   Pt has been reevaluated. There are no new complaints, changes, or physical findings at this time. Medications have been reviewed w/ pt and/or family. Pt and/or family's questions have been answered.  Pt and/or family expressed good understanding of the dx/tx/rx and is in agreement with plan of care. Pt instructed and agreed to f/u w/ Dentist and to return to ED upon further deterioration. Pt is ready for discharge.         IMPRESSION:      1.  Jaw pain         2.  Pain, dental               Return to ED if worse       Procedures

## 2019-07-18 NOTE — ED Notes (Signed)
Patient is spanish speaking, interpretor [phone] used for triage.     Patient reports left sided facial and ear pain x8 days, denies any numbness/ tingling.

## 2019-07-18 NOTE — ED Notes (Signed)
The patient left the Emergency Department ambulatory, alert and oriented and in no acute distress. The patient was encouraged to call or return to the ED for worsening issues or problems and was encouraged to schedule a follow up appointment for continuing care.      The patient verbalized understanding of discharge instructions , all questions were answered. The patient has no further concerns at this time.

## 2019-09-24 ENCOUNTER — Ambulatory Visit: Attending: Medical | Primary: Family Medicine

## 2019-09-24 ENCOUNTER — Ambulatory Visit: Admit: 2019-09-24 | Discharge: 2019-09-24 | Attending: Medical | Primary: Family Medicine

## 2019-09-24 DIAGNOSIS — L219 Seborrheic dermatitis, unspecified: Secondary | ICD-10-CM

## 2019-09-24 MED ORDER — KETOCONAZOLE 2 % SHAMPOO
2 % | CUTANEOUS | 2 refills | Status: DC
Start: 2019-09-24 — End: 2020-03-18

## 2019-09-24 MED ORDER — TRIAMCINOLONE ACETONIDE 0.5 % OINTMENT
0.5 % | CUTANEOUS | 0 refills | Status: DC
Start: 2019-09-24 — End: 2019-12-04

## 2019-09-24 NOTE — Progress Notes (Signed)
Coordination of Care  1. Have you been to the ER, urgent care clinic since your last visit?  Hospitalized since your last visit? No    2. Have you seen or consulted any other health care providers outside of the [facility] since your last visit?  Include any pap smears or colon screening. No    Does the patient need refills? NO    Learning Assessment Complete? yes  Depression Screening complete in the past 12 months?

## 2019-09-24 NOTE — Progress Notes (Signed)
A lab letter was printed and mailed to the pt. Dena Proctor, RN

## 2019-09-24 NOTE — Progress Notes (Signed)
I have copied the provider's check out note here and have reviewed these items with the patient today: Check-out Note: Avs   Labs   Goodrx please both meds with instructions     0 Return in about 3 months (around 12/25/2019) for recheck with me, f43f or doxy me .  Patient verbalized understanding. Interpreter # [phone] with [employer] assisted. Lennice Sites, RN

## 2019-09-24 NOTE — Progress Notes (Signed)
Thyroid, diabetes and liver and kidney tests are all normal. Please send her a letter with this information, thank you

## 2019-09-24 NOTE — Progress Notes (Signed)
Assessment/Plan:    Diagnoses and all orders for this visit:    1. Seborrheic dermatitis of scalp  -     triamcinolone acetonide (KENALOG) 0.5 % ointment; use thin layer bid to affected scalp. Disp 3 tubes of 15g ointment, goodrx price $17.62  -     ketoconazole (NIZORAL) 2 % shampoo; Sig: apply to scalp, leave on for 5 min then rinse off. Use 3 times a week    2. Overweight  -     TSH 3RD GENERATION; Future  -     METABOLIC PANEL, COMPREHENSIVE; Future    3. Acanthosis nigricans  -     HEMOGLOBIN A1C WITH EAG; Future  -     METABOLIC PANEL, COMPREHENSIVE; Future        Follow-up and Dispositions    ?? Return in about 3 months (around 12/25/2019) for recheck with me, f76f or doxy me .         Katherine Bali Yukie Bergeron, PA-C  [name] expressed understanding of this plan. An AVS was printed and given to the patient.      ----------------------------------------------------------------------    Chief Complaint   Patient presents with   ??? Skin Problem     pt c/o left small ball on neck with pain x 1 week   ??? Alopecia     pt c/o hair loss x 1 month       History of Present Illness:    29 yo non pregnant female presents for scalp and hair problem  The problem is a flaky rash that is causing annular patches of hair loss  She has flaking visible on her clothes  She has tried selsun blue and 2 other shampoos and nothing is helping  2 years post partum, not breast feeding, using condoms for birth control, had a period this month. Had normal pregnancy  Breast fed for 6 months  No hx of PIH or DM or even family hx of DM (her parents were recently checked she states)      No past medical history on file.    Current Outpatient Medications   Medication Sig Dispense Refill   ??? triamcinolone acetonide (KENALOG) 0.5 % ointment use thin layer bid to affected scalp. Disp 3 tubes of 15g ointment, goodrx price $17.62 45 g 0   ??? ketoconazole (NIZORAL) 2 % shampoo Sig: apply to scalp, leave on for 5 min then rinse off. Use 3 times a  week 120 mL 2       No Known Allergies    Social History     Tobacco Use   ??? Smoking status: Never Smoker   ??? Smokeless tobacco: Never Used   Substance Use Topics   ??? Alcohol use: Not Currently   ??? Drug use: Never       No family history on file.    Physical Exam:     Visit Vitals  BP 98/63 (BP 1 Location: Left arm, BP Patient Position: Sitting)   Pulse 71   Temp 98.1 ??F (36.7 ??C) (Temporal)   Ht 5' 0.04" (1.525 m)   Wt 147 lb 12.8 oz (67 kg)   LMP 09/12/2019   SpO2 98%   BMI 28.83 kg/m??       A&Ox3  WDWN NAD  Respirations normal and non labored  Scalp- she has flaky white exudate from her scalp and she has several annular shaped lesions with almost complete hair loss  At the base of her hairline, there  is some excoriation with subcutaneous pea sized nodular lesions   Neck with velvety dark skin  Thyroid not enlarged

## 2019-09-25 ENCOUNTER — Inpatient Hospital Stay: Admit: 2019-09-25 | Primary: Family Medicine

## 2019-09-25 LAB — METABOLIC PANEL, COMPREHENSIVE
A-G Ratio: 1.3 (ref 1.1–2.2)
ALT (SGPT): 20 U/L (ref 12–78)
AST (SGOT): 13 U/L — ABNORMAL LOW (ref 15–37)
Albumin: 3.9 g/dL (ref 3.5–5.0)
Alk. phosphatase: 79 U/L (ref 45–117)
Anion gap: 5 mmol/L (ref 5–15)
BUN/Creatinine ratio: 20 (ref 12–20)
BUN: 9 MG/DL (ref 6–20)
Bilirubin, total: 0.4 MG/DL (ref 0.2–1.0)
CO2: 24 mmol/L (ref 21–32)
Calcium: 8.4 MG/DL — ABNORMAL LOW (ref 8.5–10.1)
Chloride: 112 mmol/L — ABNORMAL HIGH (ref 97–108)
Creatinine: 0.45 MG/DL — ABNORMAL LOW (ref 0.55–1.02)
GFR est AA: >60 mL/min/{1.73_m2} (ref >60)
GFR est non-AA: >60 mL/min/{1.73_m2} (ref >60)
Globulin: 2.9 g/dL (ref 2.0–4.0)
Glucose: 101 mg/dL — ABNORMAL HIGH (ref 65–100)
Potassium: 3.9 mmol/L (ref 3.5–5.1)
Protein, total: 6.8 g/dL (ref 6.4–8.2)
Sodium: 141 mmol/L (ref 136–145)

## 2019-09-25 LAB — TSH 3RD GENERATION
TSH: 0.94 u[IU]/mL (ref 0.36–3.74)
TSH: 0.94 u[IU]/mL (ref 0.36–3.74)

## 2019-09-25 LAB — HEMOGLOBIN A1C WITH EAG
Est. average glucose: 111 mg/dL
Hemoglobin A1c: 5.5 % (ref 4.0–5.6)

## 2019-09-25 LAB — HEMOGLOBIN A1C W/EAG
Hemoglobin A1C: 5.5 % (ref 4.0–5.6)
eAG: 111 mg/dL

## 2019-09-25 LAB — COMPREHENSIVE METABOLIC PANEL
ALT: 20 U/L (ref 12–78)
AST: 13 U/L — ABNORMAL LOW (ref 15–37)
Albumin/Globulin Ratio: 1.3 (ref 1.1–2.2)
Albumin: 3.9 g/dL (ref 3.5–5.0)
Alkaline Phosphatase: 79 U/L (ref 45–117)
Anion Gap: 5 mmol/L (ref 5–15)
BUN: 9 MG/DL (ref 6–20)
Bun/Cre Ratio: 20 (ref 12–20)
CO2: 24 mmol/L (ref 21–32)
Calcium: 8.4 MG/DL — ABNORMAL LOW (ref 8.5–10.1)
Chloride: 112 mmol/L — ABNORMAL HIGH (ref 97–108)
Creatinine: 0.45 MG/DL — ABNORMAL LOW (ref 0.55–1.02)
EGFR IF NonAfrican American: >60 mL/min/{1.73_m2} (ref >60)
GFR African American: >60 mL/min/{1.73_m2} (ref >60)
Globulin: 2.9 g/dL (ref 2.0–4.0)
Glucose: 101 mg/dL — ABNORMAL HIGH (ref 65–100)
Potassium: 3.9 mmol/L (ref 3.5–5.1)
Sodium: 141 mmol/L (ref 136–145)
Total Bilirubin: 0.4 MG/DL (ref 0.2–1.0)
Total Protein: 6.8 g/dL (ref 6.4–8.2)

## 2019-11-02 ENCOUNTER — Ambulatory Visit: Attending: Family Medicine | Primary: Family Medicine

## 2019-11-02 ENCOUNTER — Encounter: Attending: Family Medicine | Primary: Family Medicine

## 2019-11-02 ENCOUNTER — Ambulatory Visit: Admit: 2019-11-02 | Discharge: 2019-11-02 | Attending: Family Medicine | Primary: Family Medicine

## 2019-11-02 DIAGNOSIS — G4452 New daily persistent headache (NDPH): Secondary | ICD-10-CM

## 2019-11-02 MED ORDER — NAPROXEN 500 MG TAB
500 mg | ORAL_TABLET | Freq: Two times a day (BID) | ORAL | 0 refills | Status: AC
Start: 2019-11-02 — End: 2019-12-02

## 2019-11-02 NOTE — Progress Notes (Signed)
I reviewed with patient medications sent to pharmacy.  An After Visit Summary was printed and reviewed with the patient.  Katherine Patterson

## 2019-11-02 NOTE — Progress Notes (Signed)
Katherine Patterson is a 29 y.o. female   Chief Complaint   Patient presents with   ??? Neck Pain     x 1 week         ASSESSMENT AND PLAN:    1. New daily persistent headache  And lightheadedness. Additional symptoms include anterior neck pain and B/L hand aching.  -Labs  -PT had similar symptoms with prevous pregnancies. Will get pregnancy test, though timing may be early - pt told that a negative now doesn't necessarily mean that she isn't pregnant.  Treat with Naproxen BID  F/U pending labs    - CBC WITH AUTOMATED DIFF; Future  - VITAMIN D, 25 HYDROXY; Future  - TSH 3RD GENERATION; Future  - naproxen (NAPROSYN) 500 mg tablet; Take 1 Tablet by mouth two (2) times daily (with meals) for 30 days. Indications: headache  Dispense: 60 Tablet; Refill: 0  - HCG QL SERUM; Future          SUBJECTIVE:    HPI:  Katherine Patterson is a 29 y.o. female who presents for evaluation of a generalized headache that developed about 10 days ago and has been worsening and has not responded to tylenol or motrin.  She has also had episodes of lightheadedness that are sometimes but not always associated with the headaches.  No vision changes or eye pain, No N/V, no photoor phonophobia.  She has also noticed a tender spot on her throat and sorenss in both her hands.   No fevers, fatigue, weight changes. No trouble swallowing.  Eating, drinking and sleeping normally.    Patient states she had similar symptoms with her last pregnancies. Her LMP was 15Sept and she did have unprotected intercourse since then.      ROS per HPI, otherwise negative       BP 102/65 (BP 1 Location: Left upper arm, BP Patient Position: Sitting)    Pulse 77    Temp 98.3 ??F (36.8 ??C) (Oral)    Ht 4' 11.53" (1.512 m)    Wt 147 lb (66.7 kg)    LMP 10/10/2019    SpO2 96%    BMI 29.17 kg/m??     Physical Exam  Constitutional:       General: She is not in acute distress.     Appearance: Normal appearance.   HENT:      Head: Normocephalic and atraumatic.       Mouth/Throat:      Mouth: Mucous membranes are moist.      Pharynx: Oropharynx is clear. No oropharyngeal exudate or posterior oropharyngeal erythema.   Eyes:      Extraocular Movements: Extraocular movements intact.      Conjunctiva/sclera: Conjunctivae normal.      Pupils: Pupils are equal, round, and reactive to light.   Neck:      Vascular: No carotid bruit.      Comments: No thyromegaly. Area of tenderness is above the thyroid  Cardiovascular:      Rate and Rhythm: Normal rate and regular rhythm.      Pulses: Normal pulses.      Heart sounds: Normal heart sounds.   Pulmonary:      Effort: Pulmonary effort is normal.      Breath sounds: Normal breath sounds.   Abdominal:      General: Bowel sounds are normal. There is no distension.      Palpations: Abdomen is soft.      Tenderness: There is no abdominal tenderness.  Musculoskeletal:         General: Normal range of motion.      Cervical back: No tenderness.      Right lower leg: No edema.      Left lower leg: No edema.   Lymphadenopathy:      Cervical: No cervical adenopathy.   Skin:     General: Skin is warm.   Neurological:      Mental Status: She is alert and oriented to person, place, and time.      Gait: Gait normal.      Deep Tendon Reflexes: Reflexes normal.   Psychiatric:         Behavior: Behavior normal.

## 2019-11-02 NOTE — Progress Notes (Signed)
Coordination of Care  1. Have you been to the ER, urgent care clinic since your last visit?  Hospitalized since your last visit? No    2. Have you seen or consulted any other health care providers outside of the [facility] since your last visit?  Include any pap smears or colon screening. No    Does the patient need refills? N/A    Learning Assessment Complete? yes

## 2019-11-02 NOTE — Progress Notes (Signed)
Please let her know that her Vit D is quite low. I put a Vit D supplement pill at [facility] that she should take every day for 3 months.  She should continue the naproxyn.    She is not anemic  Her thyroid is normal. Her blood pregnancy test was negative.  Thank you.

## 2019-11-02 NOTE — Progress Notes (Signed)
Tc to the pt. A female answered the phone and stated the pt was not available today. He stated he would call her and get her to call the nurse. The pt returned the call [facility] Int # [phone]. She was given the providers message. She was told about the Vit D supplement to take daily. She verbalized understanding. Felizardo Hoffmann, RN

## 2019-11-03 ENCOUNTER — Inpatient Hospital Stay: Admit: 2019-11-03 | Primary: Family Medicine

## 2019-11-03 LAB — VITAMIN D, 25 HYDROXY: Vitamin D 25-Hydroxy: 15.4 ng/mL — ABNORMAL LOW (ref 30–100)

## 2019-11-03 LAB — CBC WITH AUTOMATED DIFF
ABS. BASOPHILS: 0 10*3/uL (ref 0.0–0.1)
ABS. EOSINOPHILS: 0.2 10*3/uL (ref 0.0–0.4)
ABS. IMM. GRANS.: 0 10*3/uL (ref 0.00–0.04)
ABS. LYMPHOCYTES: 1.9 10*3/uL (ref 0.8–3.5)
ABS. MONOCYTES: 0.7 10*3/uL (ref 0.0–1.0)
ABS. NEUTROPHILS: 4.7 10*3/uL (ref 1.8–8.0)
ABSOLUTE NRBC: 0 10*3/uL (ref 0.00–0.01)
BASOPHILS: 0 % (ref 0–1)
EOSINOPHILS: 2 % (ref 0–7)
HCT: 40.4 % (ref 35.0–47.0)
HGB: 13.3 g/dL (ref 11.5–16.0)
IMMATURE GRANULOCYTES: 0 % (ref 0.0–0.5)
LYMPHOCYTES: 25 % (ref 12–49)
MCH: 30.4 PG (ref 26.0–34.0)
MCHC: 32.9 g/dL (ref 30.0–36.5)
MCV: 92.4 FL (ref 80.0–99.0)
MONOCYTES: 9 % (ref 5–13)
MPV: 11.3 FL (ref 8.9–12.9)
NEUTROPHILS: 64 % (ref 32–75)
NRBC: 0 PER 100 WBC
PLATELET: 265 10*3/uL (ref 150–400)
RBC: 4.37 M/uL (ref 3.80–5.20)
RDW: 12.1 % (ref 11.5–14.5)
WBC: 7.5 10*3/uL (ref 3.6–11.0)

## 2019-11-03 LAB — TSH 3RD GENERATION
TSH: 1.07 u[IU]/mL (ref 0.36–3.74)
TSH: 1.07 u[IU]/mL (ref 0.36–3.74)

## 2019-11-03 LAB — HCG QL SERUM
HCG(Serum) Pregnancy Test: NEGATIVE
HCG, Ql.: NEGATIVE

## 2019-11-03 LAB — CBC WITH AUTO DIFFERENTIAL
Basophils %: 0 % (ref 0–1)
Basophils Absolute: 0 10*3/uL (ref 0.0–0.1)
Eosinophils %: 2 % (ref 0–7)
Eosinophils Absolute: 0.2 10*3/uL (ref 0.0–0.4)
Granulocyte Absolute Count: 0 10*3/uL (ref 0.00–0.04)
Hematocrit: 40.4 % (ref 35.0–47.0)
Hemoglobin: 13.3 g/dL (ref 11.5–16.0)
Immature Granulocytes: 0 % (ref 0.0–0.5)
Lymphocytes %: 25 % (ref 12–49)
Lymphocytes Absolute: 1.9 10*3/uL (ref 0.8–3.5)
MCH: 30.4 PG (ref 26.0–34.0)
MCHC: 32.9 g/dL (ref 30.0–36.5)
MCV: 92.4 FL (ref 80.0–99.0)
MPV: 11.3 FL (ref 8.9–12.9)
Monocytes %: 9 % (ref 5–13)
Monocytes Absolute: 0.7 10*3/uL (ref 0.0–1.0)
NRBC Absolute: 0 10*3/uL (ref 0.00–0.01)
Neutrophils %: 64 % (ref 32–75)
Neutrophils Absolute: 4.7 10*3/uL (ref 1.8–8.0)
Nucleated RBCs: 0 PER 100 WBC
Platelets: 265 10*3/uL (ref 150–400)
RBC: 4.37 M/uL (ref 3.80–5.20)
RDW: 12.1 % (ref 11.5–14.5)
WBC: 7.5 10*3/uL (ref 3.6–11.0)

## 2019-11-03 LAB — VITAMIN D 25 HYDROXY: Vit D, 25-Hydroxy: 15.4 ng/mL — ABNORMAL LOW (ref 30–100)

## 2019-11-07 ENCOUNTER — Encounter

## 2019-11-07 MED ORDER — CHOLECALCIFEROL (VITAMIN D3) 2,000 UNIT CAPSULE
ORAL_CAPSULE | Freq: Every day | ORAL | 0 refills | Status: AC
Start: 2019-11-07 — End: 2020-02-05

## 2019-11-15 ENCOUNTER — Encounter: Attending: Family Medicine | Primary: Family Medicine

## 2019-12-04 ENCOUNTER — Telehealth: Attending: Family Medicine | Primary: Family Medicine

## 2019-12-04 ENCOUNTER — Telehealth: Admit: 2019-12-04 | Discharge: 2019-12-04 | Attending: Family Medicine | Primary: Family Medicine

## 2019-12-04 DIAGNOSIS — N760 Acute vaginitis: Secondary | ICD-10-CM

## 2019-12-04 MED ORDER — FLUCONAZOLE 150 MG TAB
150 mg | ORAL_TABLET | Freq: Every day | ORAL | 0 refills | Status: AC
Start: 2019-12-04 — End: 2019-12-05

## 2019-12-04 MED ORDER — TRIAMCINOLONE ACETONIDE 0.5 % OINTMENT
0.5 % | CUTANEOUS | 0 refills | Status: DC
Start: 2019-12-04 — End: 2020-07-21

## 2019-12-04 NOTE — Progress Notes (Signed)
Coordination of Care  1. Have you been to the ER, urgent care clinic since your last visit?  Hospitalized since your last visit? No    2. Have you seen or consulted any other health care providers outside of the [facility] since your last visit?  Include any pap smears or colon screening. No    Does the patient need refills? NO    Learning Assessment Complete? yes  Depression Screening complete in the past 12 months? yes

## 2019-12-04 NOTE — Progress Notes (Signed)
Katherine Patterson (DOB: 1990-05-14) is a 29 y.o. female, established patient, here for evaluation of the following chief complaint(s):   Rash (rash on axila area on both side x1 week)       ASSESSMENT/PLAN:  1. Acute vaginitis  Treat with Diflucan  2. Rash  Local dermatitis.  Discussed triggers such as recent medications, detergents, foods.  Since it is improved with Kenalog she may use this and if not resolved in 2 weeks she will follow up for F2F exam.  -     triamcinolone acetonide (KENALOG) 0.5 % ointment; use thin layer bid to affected scalp. Disp 3 tubes of 15g ointment, goodrx price $17.62, Normal, Disp-45 g, R-0      No follow-ups on file.    SUBJECTIVE/OBJECTIVE:  HPI Doxy.me visit was attempted but discontinued due to poor connection.  Rash: pruritic, red, bumpy rash around arms where they rub but not in axilla x 1 week.  No change in deoderant.  Had similar groin rash 2 weeks ago which improve with using Kenalog cream.  Vaginitis:  3 months of vaginal pruritis and white DC.  Used some OTC creams and it resolved but recurred about 3-4 weeks ago.  Is wondering if she could use a oral medication.    Review of Systems   Constitutional: Negative for chills and fever.   Genitourinary: Negative for pelvic pain.      Patient-Reported LMP: 11/09/2019    Physical Exam    On this date 12/04/2019 I have spent 18 minutes reviewing previous notes, test results and face to face (virtual) with the patient discussing the diagnosis and importance of compliance with the treatment plan as well as documenting on the day of the visit.    Katherine Patterson, was evaluated through a synchronous (real-time) audio-video encounter. The patient (or guardian if applicable) is aware that this is a billable service. Verbal consent to proceed has been obtained within the past 12 months. The visit was conducted pursuant to the emergency declaration under the [employer] and the [employer], 1135 waiver authority  and the [employer] and [employer] Act.  Patient identification was verified, and a caregiver was present when appropriate. The patient was located in a state where the provider was credentialed to provide care.     Patient identification was verified at the start of the visit: YES    Services were provided through a phone synchronous discussion virtually to substitute for in-person clinic visit. Patient was located at home and provider was located in office or at home.     An electronic signature was used to authenticate this note.  -- Marita Kansas, MD

## 2019-12-25 ENCOUNTER — Telehealth: Attending: Family Medicine | Primary: Family Medicine

## 2019-12-25 ENCOUNTER — Telehealth: Admit: 2019-12-25 | Discharge: 2019-12-25 | Attending: Family Medicine | Primary: Family Medicine

## 2019-12-25 DIAGNOSIS — B349 Viral infection, unspecified: Secondary | ICD-10-CM

## 2019-12-25 MED ORDER — ITRACONAZOLE 100 MG CAP
100 mg | ORAL_CAPSULE | Freq: Two times a day (BID) | ORAL | 0 refills | Status: AC
Start: 2019-12-25 — End: 2019-12-26

## 2019-12-25 MED ORDER — FAMOTIDINE 40 MG TAB
40 mg | ORAL_TABLET | Freq: Every evening | ORAL | 0 refills | Status: DC
Start: 2019-12-25 — End: 2020-03-18

## 2019-12-25 MED ORDER — MELOXICAM 7.5 MG TAB
7.5 mg | ORAL_TABLET | Freq: Every day | ORAL | 0 refills | Status: DC
Start: 2019-12-25 — End: 2020-03-18

## 2019-12-25 NOTE — Progress Notes (Signed)
Coordination of Care  1. Have you been to the ER, urgent care clinic since your last visit?  Hospitalized since your last visit? No    2. Have you seen or consulted any other health care providers outside of the [facility] since your last visit?  Include any pap smears or colon screening. No    Does the patient need refills? NO    Learning Assessment Complete? yes  Depression Screening complete in the past 12 months? yes

## 2019-12-25 NOTE — Progress Notes (Signed)
T/C made to the patient with assistance from [employer] interpreter [phone] to review her three new prescriptions and that they will be less expensive overall if filled at [facility] or [facility] rather than [facility]. After discussing the locations of several [facility] and [employer], the patient decided to keep the prescriptions at [facility] and confirmed receipt of the two coupons sent to her by text from the [facility] site. One of her prescriptions does not need a coupon to receive the lowest prive at [facility]. She understands to show the coupons to the pharmacy when she picks up her prescriptions and had no questions regarding today's virtual provider visit.    Patient's AVS was printed and placed in the mail to her home address. Amy Devonne Doughty, RN

## 2019-12-25 NOTE — Progress Notes (Signed)
Katherine Patterson (DOB: February 03, 1990) is a 29 y.o. female, established patient, here for evaluation of the following chief complaint(s):   Flu Like Symptoms (denies, since 4 days ago) and Cough       ASSESSMENT/PLAN:  1. Viral illness  Acute onset of her aches and cough suggest her aches are from acute viral illness and not an acute deficiency.  Recommended sx tx with Tylenol.  Will use Mobic low dose for her aches given her hx of gastritis.  2. Epigastric pain  This may be from her acute viral sx or gastritis.  Use Pepcid for a few weeks.  3. Chronic vaginitis  Improved but not resolved. Will try one day of Sporanox.  Follow up for exam if not resolved.    No follow-ups on file.    SUBJECTIVE/OBJECTIVE:  HPI  Cough: x 4 days.  NO fevers, dyspnea, wheezing, sore throat.  Vitamin D3 Deficiency:  Had headache and joint pain 3 months ago which improved with Vitamin D supplement.  Now 4 days with body aches and headache and is concerned she is not taking enough Vitamin D.  Epigastric Pain:  8 days of burning in epigastric area.  Prior hx of gastritis.  No nausea, vomiting, diarrhea.  Vaginitis:  Improved with Diflucan but has not completely resolved.  Vaginal pruritis still occurs but now is off/on.  Is wondering if she can retry tx.    Review of Systems   Constitutional: Negative for appetite change, chills, diaphoresis, fatigue and fever.   HENT: Negative for congestion, sinus pain, sore throat and trouble swallowing.    Respiratory: Positive for cough. Negative for shortness of breath and wheezing.    Cardiovascular: Negative for chest pain.   Gastrointestinal: Positive for abdominal pain. Negative for blood in stool, constipation, diarrhea, nausea and vomiting.   Genitourinary: Negative for vaginal bleeding, vaginal discharge and vaginal pain.   Musculoskeletal: Positive for arthralgias. Negative for joint swelling.   Neurological: Positive for headaches. Negative for weakness and numbness.         Patient-Reported Weight: 147lb  Patient-Reported LMP: 11/30/19      Physical Exam    On this date 12/25/2019 I have spent 15 minutes reviewing previous notes, test results and face to face (virtual) with the patient discussing the diagnosis and importance of compliance with the treatment plan as well as documenting on the day of the visit.    Sairah Carlei Huang, was evaluated through a synchronous (real-time) audio-video encounter. The patient (or guardian if applicable) is aware that this is a billable service. Verbal consent to proceed has been obtained within the past 12 months. The visit was conducted pursuant to the emergency declaration under the [employer] and the [employer], 1135 waiver authority and the [employer] Act.  Patient identification was verified, and a caregiver was present when appropriate. The patient was located in a state where the provider was credentialed to provide care.     Patient identification was verified at the start of the visit: YES    Services were provided through a phone synchronous discussion virtually to substitute for in-person clinic visit. Patient was located at home and provider was located in office or at home.     An electronic signature was used to authenticate this note.  -- Marita Kansas, MD

## 2020-03-12 ENCOUNTER — Ambulatory Visit: Attending: Medical | Primary: Family Medicine

## 2020-03-12 ENCOUNTER — Ambulatory Visit: Admit: 2020-03-12 | Discharge: 2020-03-12 | Attending: Medical | Primary: Family Medicine

## 2020-03-12 DIAGNOSIS — Z139 Encounter for screening, unspecified: Secondary | ICD-10-CM

## 2020-03-12 LAB — AMB POC URINALYSIS DIP STICK MANUAL W/O MICRO
Bilirubin (UA POC): NEGATIVE
Bilirubin, Urine, POC: NEGATIVE
Glucose (UA POC): NEGATIVE
Glucose, Urine, POC: NEGATIVE
Ketones (UA POC): NEGATIVE
Ketones, Urine, POC: NEGATIVE
Nitrite, Urine, POC: NEGATIVE
Nitrites (UA POC): NEGATIVE
Protein (UA POC): NEGATIVE
Protein, Urine, POC: NEGATIVE
Specific Gravity, Urine, POC: 1.02 NA (ref 1.001–1.035)
Specific gravity (UA POC): 1.02 (ref 1.001–1.035)
Urobilinogen (UA POC): 0.2 (ref 0.2–1)
Urobilinogen, POC: 0.2 (ref 0.2–1)
pH (UA POC): 6 (ref 4.6–8.0)
pH, Urine, POC: 6 NA (ref 4.6–8.0)

## 2020-03-12 MED ORDER — MICONAZOLE NITRATE 2 % VAGINAL CREAM
2 % | Freq: Every evening | VAGINAL | 0 refills | Status: DC
Start: 2020-03-12 — End: 2020-07-21

## 2020-03-12 MED ORDER — FLUCONAZOLE 150 MG TAB
150 mg | ORAL_TABLET | Freq: Every day | ORAL | 0 refills | Status: AC
Start: 2020-03-12 — End: 2020-03-13

## 2020-03-12 MED ORDER — NITROFURANTOIN (25% MACROCRYSTAL FORM) 100 MG CAP
100 mg | ORAL_CAPSULE | Freq: Two times a day (BID) | ORAL | 0 refills | Status: DC
Start: 2020-03-12 — End: 2020-07-21

## 2020-03-12 NOTE — Progress Notes (Signed)
Assessment/Plan:    Diagnoses and all orders for this visit:    1. Encounter for screening  -     AMB POC URINALYSIS DIP STICK MANUAL W/O MICRO    2. Acute cystitis without hematuria  -     nitrofurantoin, macrocrystal-monohydrate, (Macrobid) 100 mg capsule; Take 1 Capsule by mouth two (2) times a day.    3. Vaginal yeast infection  -     miconazole (Monistat 7) 2 % vaginal cream; Insert 1 Applicator into vagina nightly.  -     fluconazole (DIFLUCAN) 150 mg tablet; Take 1 Tablet by mouth daily for 1 day. FDA advises cautious prescribing of oral fluconazole in pregnancy.        Follow-up and Dispositions    ?? Return in about 1 week (around 03/19/2020) for vv with me please .         West Bali Romell Cavanah, PA-C  [name] expressed understanding of this plan. An AVS was printed and given to the patient.      ----------------------------------------------------------------------    Chief Complaint   Patient presents with   ??? Abdominal Pain     after urininating, itching,white discharge       History of Present Illness:    Pt presents for dysuria, burning with urination, feels like incomplete emptying, now also accompanied by vaginal itching  No fever   Uses condoms for birth control. LMP 02/14/20, has not had any unprotected sex since then    No past medical history on file.    Current Outpatient Medications   Medication Sig Dispense Refill   ??? nitrofurantoin, macrocrystal-monohydrate, (Macrobid) 100 mg capsule Take 1 Capsule by mouth two (2) times a day. 14 Capsule 0   ??? miconazole (Monistat 7) 2 % vaginal cream Insert 1 Applicator into vagina nightly. 45 g 0   ??? fluconazole (DIFLUCAN) 150 mg tablet Take 1 Tablet by mouth daily for 1 day. FDA advises cautious prescribing of oral fluconazole in pregnancy. 1 Tablet 0   ??? meloxicam (MOBIC) 7.5 mg tablet Take 1 Tablet by mouth daily. (Patient not taking: Reported on 03/12/2020) 15 Tablet 0   ??? famotidine (PEPCID) 40 mg tablet Take 1 Tablet by mouth nightly.  (Patient not taking: Reported on 03/12/2020) 30 Tablet 0   ??? triamcinolone acetonide (KENALOG) 0.5 % ointment use thin layer bid to affected scalp. Disp 3 tubes of 15g ointment, goodrx price $17.62 (Patient not taking: Reported on 03/12/2020) 45 g 0   ??? ketoconazole (NIZORAL) 2 % shampoo Sig: apply to scalp, leave on for 5 min then rinse off. Use 3 times a week (Patient not taking: Reported on 03/12/2020) 120 mL 2       No Known Allergies    Social History     Tobacco Use   ??? Smoking status: Never Smoker   ??? Smokeless tobacco: Never Used   Substance Use Topics   ??? Alcohol use: Not Currently   ??? Drug use: Never       No family history on file.    Physical Exam:     Visit Vitals  BP 112/72 (BP 1 Location: Left upper arm)   Pulse 92   Temp 97.5 ??F (36.4 ??C) (Temporal)   Ht 4' 11.69" (1.516 m)   Wt 147 lb 3.2 oz (66.8 kg)   LMP 02/14/2020   SpO2 100%   BMI 29.05 kg/m??       A&Ox3  WDWN NAD  Respirations normal and non labored  Results for orders placed or performed in visit on 03/12/20   AMB POC URINALYSIS DIP STICK MANUAL W/O MICRO     Status: None   Result Value Ref Range Status    Color (UA POC) Yellow  Final    Clarity (UA POC) Clear  Final    Glucose (UA POC) Negative Negative Final    Bilirubin (UA POC) Negative Negative Final    Ketones (UA POC) Negative Negative Final    Specific gravity (UA POC) 1.020 1.001 - 1.035 Final    Blood (UA POC) Trace Negative Final    pH (UA POC) 6.0 4.6 - 8.0 Final    Protein (UA POC) Negative Negative Final    Urobilinogen (UA POC) 0.2 mg/dL 0.2 - 1 Final    Nitrites (UA POC) Negative Negative Final    Leukocyte esterase (UA POC) 3+ Negative Final

## 2020-03-12 NOTE — Progress Notes (Signed)
Coordination of Care  1. Have you been to the ER, urgent care clinic since your last visit?  Hospitalized since your last visit? No    2. Have you seen or consulted any other health care providers outside of the [facility] since your last visit?  Include any pap smears or colon screening. No    Does the patient need refills? NO    Learning Assessment Complete? yes  Depression Screening complete in the past 12 months? yes

## 2020-03-12 NOTE — Progress Notes (Signed)
I reviewed with patient medications sent to pharmacy and gave Good RX card. I gave handout to patient that explains the Good RX process.   An After Visit Summary was printed and reviewed with the patient. Informed patient to give name and date of birth when picking up medication. Patient verbalized understanding.  Katherine Patterson

## 2020-03-12 NOTE — Progress Notes (Signed)
Results for orders placed or performed in visit on 03/12/20   AMB POC URINALYSIS DIP STICK MANUAL W/O MICRO   Result Value Ref Range    Color (UA POC) Yellow     Clarity (UA POC) Clear     Glucose (UA POC) Negative Negative    Bilirubin (UA POC) Negative Negative    Ketones (UA POC) Negative Negative    Specific gravity (UA POC) 1.020 1.001 - 1.035    Blood (UA POC) Trace Negative    pH (UA POC) 6.0 4.6 - 8.0    Protein (UA POC) Negative Negative    Urobilinogen (UA POC) 0.2 mg/dL 0.2 - 1    Nitrites (UA POC) Negative Negative    Leukocyte esterase (UA POC) 3+ Negative

## 2020-03-18 ENCOUNTER — Telehealth: Attending: Medical | Primary: Family Medicine

## 2020-03-18 ENCOUNTER — Telehealth: Admit: 2020-03-18 | Discharge: 2020-03-18 | Attending: Medical | Primary: Family Medicine

## 2020-03-18 DIAGNOSIS — N3000 Acute cystitis without hematuria: Secondary | ICD-10-CM

## 2020-03-18 NOTE — Progress Notes (Signed)
Assessment/Plan:    Diagnoses and all orders for this visit:    1. Acute cystitis without hematuria    2. Yeast vaginitis    sxs resolved with current tx. Continue monistat for the full 7 days  Call for f/up PRN         West Bali Noah Pelaez, PA-C  Ondrea Christin Moline expressed understanding of this plan. An AVS was printed and given to the patient.      ----------------------------------------------------------------------    Chief Complaint   Patient presents with   ??? Results     follow up vaginal itching.       History of Present Illness:  Pt called and consented to virtual telephone visit, she declined video  She was identified by name and DOB    She is being seen today for a f/up visit for UTI and yeast vaginitis  She is essentially sx free for the past few days  She has no dysuria and her vaginal itching has resolved  She has no side effects to the medication  Reminded pt to call if she has any concerns / sxs for another visit       No past medical history on file.    Current Outpatient Medications   Medication Sig Dispense Refill   ??? nitrofurantoin, macrocrystal-monohydrate, (Macrobid) 100 mg capsule Take 1 Capsule by mouth two (2) times a day. 14 Capsule 0   ??? miconazole (Monistat 7) 2 % vaginal cream Insert 1 Applicator into vagina nightly. 45 g 0   ??? triamcinolone acetonide (KENALOG) 0.5 % ointment use thin layer bid to affected scalp. Disp 3 tubes of 15g ointment, goodrx price $17.62 (Patient not taking: Reported on 03/12/2020) 45 g 0       No Known Allergies    Social History     Tobacco Use   ??? Smoking status: Never Smoker   ??? Smokeless tobacco: Never Used   Substance Use Topics   ??? Alcohol use: Not Currently   ??? Drug use: Never       No family history on file.    Physical Exam:     There were no vitals taken for this visit.    A&Ox3  WDWN NAD  Respirations normal and non labored

## 2020-03-18 NOTE — Progress Notes (Signed)
Intake with the assistance of Sampson Si Spanish interpreter.     VS equipment is available today.    Coordination of Care  1. Have you been to the ER, urgent care clinic since your last visit?  Hospitalized since your last visit? No    2. Have you seen or consulted any other health care providers outside of the [facility] since your last visit?  Include any pap smears or colon screening. No    Does the patient need refills? NO    Learning Assessment Complete? no  Depression Screening complete in the past 12 months? yes

## 2020-07-15 ENCOUNTER — Inpatient Hospital Stay
Admit: 2020-07-15 | Discharge: 2020-07-16 | Disposition: A | Discharge status: Home / Self Care | Attending: Emergency Medicine

## 2020-07-15 ENCOUNTER — Encounter: Attending: Family Medicine | Primary: Family Medicine

## 2020-07-15 DIAGNOSIS — R079 Chest pain, unspecified: Secondary | ICD-10-CM

## 2020-07-15 LAB — METABOLIC PANEL, COMPREHENSIVE
A-G Ratio: 1.3 (ref 1.1–2.2)
ALT (SGPT): 23 U/L (ref 12–78)
AST (SGOT): 15 U/L (ref 15–37)
Albumin: 4.1 g/dL (ref 3.5–5.0)
Alk. phosphatase: 84 U/L (ref 45–117)
Anion gap: 9 mmol/L (ref 5–15)
BUN/Creatinine ratio: 21 — ABNORMAL HIGH (ref 12–20)
BUN: 15 MG/DL (ref 6–20)
Bilirubin, total: 0.2 MG/DL (ref 0.2–1.0)
CO2: 23 mmol/L (ref 21–32)
Calcium: 9.4 MG/DL (ref 8.5–10.1)
Chloride: 106 mmol/L (ref 97–108)
Creatinine: 0.7 MG/DL (ref 0.55–1.02)
GFR est AA: >60 mL/min/{1.73_m2} (ref >60)
GFR est non-AA: >60 mL/min/{1.73_m2} (ref >60)
Globulin: 3.1 g/dL (ref 2.0–4.0)
Glucose: 106 mg/dL — ABNORMAL HIGH (ref 65–100)
Potassium: 4 mmol/L (ref 3.5–5.1)
Protein, total: 7.2 g/dL (ref 6.4–8.2)
Sodium: 138 mmol/L (ref 136–145)

## 2020-07-15 LAB — CBC WITH AUTOMATED DIFF
ABS. BASOPHILS: 0 10*3/uL (ref 0.0–0.1)
ABS. EOSINOPHILS: 0.2 10*3/uL (ref 0.0–0.4)
ABS. IMM. GRANS.: 0 10*3/uL (ref 0.00–0.04)
ABS. LYMPHOCYTES: 2.1 10*3/uL (ref 0.8–3.5)
ABS. MONOCYTES: 0.5 10*3/uL (ref 0.0–1.0)
ABS. NEUTROPHILS: 3.8 10*3/uL (ref 1.8–8.0)
ABSOLUTE NRBC: 0 10*3/uL (ref 0.00–0.01)
BASOPHILS: 0 % (ref 0–1)
EOSINOPHILS: 2 % (ref 0–7)
HCT: 40.4 % (ref 35.0–47.0)
HGB: 13.7 g/dL (ref 11.5–16.0)
IMMATURE GRANULOCYTES: 0 % (ref 0.0–0.5)
LYMPHOCYTES: 31 % (ref 12–49)
MCH: 30 PG (ref 26.0–34.0)
MCHC: 33.9 g/dL (ref 30.0–36.5)
MCV: 88.6 FL (ref 80.0–99.0)
MONOCYTES: 8 % (ref 5–13)
MPV: 11.1 FL (ref 8.9–12.9)
NEUTROPHILS: 59 % (ref 32–75)
NRBC: 0 PER 100 WBC
PLATELET: 294 10*3/uL (ref 150–400)
RBC: 4.56 M/uL (ref 3.80–5.20)
RDW: 11.9 % (ref 11.5–14.5)
WBC: 6.6 10*3/uL (ref 3.6–11.0)

## 2020-07-15 LAB — TROPONIN-HIGH SENSITIVITY: Troponin-High Sensitivity: 4 ng/L (ref 0–51)

## 2020-07-15 LAB — SAMPLES BEING HELD

## 2020-07-15 LAB — LIPASE
Lipase: 83 U/L (ref 73–393)
Lipase: 83 U/L (ref 73–393)

## 2020-07-15 LAB — COMPREHENSIVE METABOLIC PANEL
ALT: 23 U/L (ref 12–78)
AST: 15 U/L (ref 15–37)
Albumin/Globulin Ratio: 1.3 (ref 1.1–2.2)
Albumin: 4.1 g/dL (ref 3.5–5.0)
Alkaline Phosphatase: 84 U/L (ref 45–117)
Anion Gap: 9 mmol/L (ref 5–15)
BUN: 15 MG/DL (ref 6–20)
Bun/Cre Ratio: 21 — ABNORMAL HIGH (ref 12–20)
CO2: 23 mmol/L (ref 21–32)
Calcium: 9.4 MG/DL (ref 8.5–10.1)
Chloride: 106 mmol/L (ref 97–108)
Creatinine: 0.7 MG/DL (ref 0.55–1.02)
EGFR IF NonAfrican American: >60 mL/min/{1.73_m2} (ref >60)
GFR African American: >60 mL/min/{1.73_m2} (ref >60)
Globulin: 3.1 g/dL (ref 2.0–4.0)
Glucose: 106 mg/dL — ABNORMAL HIGH (ref 65–100)
Potassium: 4 mmol/L (ref 3.5–5.1)
Sodium: 138 mmol/L (ref 136–145)
Total Bilirubin: 0.2 MG/DL (ref 0.2–1.0)
Total Protein: 7.2 g/dL (ref 6.4–8.2)

## 2020-07-15 LAB — TROPONIN, HIGH SENSITIVITY: Troponin, High Sensitivity: 4 ng/L (ref 0–51)

## 2020-07-15 LAB — CBC WITH AUTO DIFFERENTIAL
Basophils %: 0 % (ref 0–1)
Basophils Absolute: 0 10*3/uL (ref 0.0–0.1)
Eosinophils %: 2 % (ref 0–7)
Eosinophils Absolute: 0.2 10*3/uL (ref 0.0–0.4)
Granulocyte Absolute Count: 0 10*3/uL (ref 0.00–0.04)
Hematocrit: 40.4 % (ref 35.0–47.0)
Hemoglobin: 13.7 g/dL (ref 11.5–16.0)
Immature Granulocytes: 0 % (ref 0.0–0.5)
Lymphocytes %: 31 % (ref 12–49)
Lymphocytes Absolute: 2.1 10*3/uL (ref 0.8–3.5)
MCH: 30 PG (ref 26.0–34.0)
MCHC: 33.9 g/dL (ref 30.0–36.5)
MCV: 88.6 FL (ref 80.0–99.0)
MPV: 11.1 FL (ref 8.9–12.9)
Monocytes %: 8 % (ref 5–13)
Monocytes Absolute: 0.5 10*3/uL (ref 0.0–1.0)
NRBC Absolute: 0 10*3/uL (ref 0.00–0.01)
Neutrophils %: 59 % (ref 32–75)
Neutrophils Absolute: 3.8 10*3/uL (ref 1.8–8.0)
Nucleated RBCs: 0 PER 100 WBC
Platelets: 294 10*3/uL (ref 150–400)
RBC: 4.56 M/uL (ref 3.80–5.20)
RDW: 11.9 % (ref 11.5–14.5)
WBC: 6.6 10*3/uL (ref 3.6–11.0)

## 2020-07-15 MED ORDER — LIDOCAINE 2 % MUCOSAL SOLN
2 % | Freq: Once | Status: AC
Start: 2020-07-15 — End: 2020-07-15
  Administered 2020-07-16: via ORAL

## 2020-07-15 NOTE — Telephone Encounter (Signed)
Tc to the pt for triage. AMN # [id number]. The pt is having mid sternal chest pain of 5/10. She stated it starts as pressure, then builds. Into pain that goes to her back. Its not constant last about 2 hrs nothing makes it go away. Tylenol has made it a little better. Worst pain is 9/10 always starts as 5/10 pressure. When it happens at night it makes her sit up in bed has SOB when pain is a 9/10. It is a 5/10 now. No hx of chronic illness, denies injury, th ept denies taking any medications other than occ. Tylenol. Consulted with Dr Fayrene Fearing who is recommending th ept go to the ED. The pt was called back. AMN int # [id number]. She was advised to go to [facility] and ask about the Care Card. The Care Card process was explained to the pt. She denied further questions and does not need the address. She stated she has been there before. Felizardo Hoffmann, RN

## 2020-07-15 NOTE — ED Provider Notes (Signed)
ED Provider Notes by Juanda Chance, NP at 07/15/20 1829                Author: Juanda Chance, NP  Service: Emergency Medicine  Author Type: Nurse Practitioner       Filed: 07/17/20 1921  Date of Service: 07/15/20 1829  Status: Attested           Editor: Rembert Browe, Magnus Sinning, NP (Nurse Practitioner)  Cosigner: Truett Mainland, MD at 07/20/20 1555          Attestation signed by Truett Mainland, MD at 07/20/20 1555          I was personally available for consultation in the emergency department.  I have reviewed the chart and agree with the documentation recorded by the [facility], including  the assessment, treatment plan, and disposition.   Truett Mainland, MD                               This is a 30 year old female who presents ambulatory to the emergency room with complaints of chest discomfort  that radiates to her back.  Patient states that it is located in her epigastric region and goes "straight through to the middle of her spine."  Patient states this started approximately a week and a half ago and has continued despite the use of Tylenol.   States it was worse when she lays flat.  Denies any worsening symptoms or improvement of symptoms with food.  States she cannot identify anything that worsens or improves her symptoms rather a comes ago throughout the course of the day.  Patient states  she is unsure "if it is my heart."  Denies any shortness of breath, dizziness, nausea or vomiting, fevers or chills.  There are no further complaints at this time.      Gerilyn Nestle, MD   No past medical history on file.   No past surgical history on file.                   No past medical history on file.      No past surgical history on file.        No family history on file.        Social History          Socioeconomic History         ?  Marital status:  LEGALLY SEPARATED              Spouse name:  Not on file         ?  Number of children:  Not on file     ?  Years of education:  Not on file     ?  Highest  education level:  Not on file       Occupational History        ?  Not on file       Tobacco Use         ?  Smoking status:  Never Smoker     ?  Smokeless tobacco:  Never Used       Substance and Sexual Activity         ?  Alcohol use:  Not Currently     ?  Drug use:  Never     ?  Sexual activity:  Not on file  Other Topics  Concern        ?  Not on file       Social History Narrative          ** Merged History Encounter **                     Social Determinants of Health          Financial Resource Strain:         ?  Difficulty of Paying Living Expenses: Not on file       Food Insecurity:         ?  Worried About Programme researcher, broadcasting/film/video in the Last Year: Not on file     ?  Ran Out of Food in the Last Year: Not on file       Transportation Needs:         ?  Lack of Transportation (Medical): Not on file     ?  Lack of Transportation (Non-Medical): Not on file       Physical Activity:         ?  Days of Exercise per Week: Not on file     ?  Minutes of Exercise per Session: Not on file       Stress:         ?  Feeling of Stress : Not on file       Social Connections:         ?  Frequency of Communication with Friends and Family: Not on file     ?  Frequency of Social Gatherings with Friends and Family: Not on file     ?  Attends Religious Services: Not on file     ?  Active Member of Clubs or Organizations: Not on file     ?  Attends Banker Meetings: Not on file     ?  Marital Status: Not on file       Intimate Partner Violence:         ?  Fear of Current or Ex-Partner: Not on file     ?  Emotionally Abused: Not on file     ?  Physically Abused: Not on file     ?  Sexually Abused: Not on file       Housing Stability:         ?  Unable to Pay for Housing in the Last Year: Not on file     ?  Number of Places Lived in the Last Year: Not on file        ?  Unstable Housing in the Last Year: Not on file              ALLERGIES: Patient has no known allergies.      Review of Systems    Constitutional: Negative  for appetite change, chills, diaphoresis, fatigue and fever.    HENT: Negative for congestion, ear discharge, ear pain, sinus pressure, sinus pain, sore throat and trouble swallowing.     Eyes: Negative for photophobia, pain, redness and visual disturbance.    Respiratory: Negative for chest tightness, shortness of breath and wheezing.     Cardiovascular: Positive for chest pain. Negative for palpitations.    Gastrointestinal: Negative for abdominal distention, abdominal pain, nausea and vomiting.    Endocrine: Negative.     Genitourinary: Negative for difficulty urinating, flank pain, frequency and urgency.    Musculoskeletal:  Negative for back pain, neck pain and neck stiffness.    Skin: Negative for color change, pallor, rash and wound.    Allergic/Immunologic: Negative.     Neurological: Negative for dizziness, speech difficulty, weakness and headaches.    Hematological: Does not bruise/bleed easily.    Psychiatric/Behavioral: Negative for behavioral problems. The patient is not nervous/anxious.             Vitals:          07/15/20 1750        BP:  99/65     Pulse:  68     Resp:  16     Temp:  97.5 ??F (36.4 ??C)     SpO2:  99%     Weight:  67.1 kg (148 lb)        Height:  4' 11.84" (1.52 m)                Physical Exam   Vitals and nursing note reviewed.   Constitutional:        General: She is not in acute distress.     Appearance: Normal appearance. She is well-developed. She is not ill-appearing.    HENT:       Head: Normocephalic and atraumatic.      Right Ear: External ear normal.      Left Ear: External ear normal.      Nose: Nose normal. No congestion.      Mouth/Throat:      Mouth: Mucous membranes are moist.   Eyes:       General:         Right eye: No discharge.         Left eye: No discharge.      Conjunctiva/sclera: Conjunctivae normal.      Pupils: Pupils are equal, round, and reactive to light.   Neck:       Vascular: No JVD.      Trachea: No tracheal deviation.    Cardiovascular:       Rate and  Rhythm: Normal rate and regular rhythm.      Pulses: Normal pulses.      Heart sounds: Normal heart sounds. No murmur heard.   No gallop.     Pulmonary:       Effort: Pulmonary effort is normal. No respiratory distress.      Breath sounds: Normal breath sounds. No wheezing or rales.   Chest:       Chest wall: No tenderness.   Abdominal :      General: Bowel sounds are normal. There is no distension.      Palpations: Abdomen is soft.      Tenderness: There is no abdominal tenderness. There is no guarding or rebound.    Genitourinary :      Comments: Negative    Musculoskeletal:          General: No tenderness. Normal range of motion.      Cervical back: Normal range of motion and neck supple. No tenderness.    Skin:      General: Skin is warm and dry.      Capillary Refill: Capillary refill takes less than 2 seconds.      Coloration: Skin is not pale.      Findings: No erythema or rash.   Neurological:       General: No focal deficit present.      Mental Status: She is alert and oriented to person, place,  and time.      Motor: No weakness.      Coordination: Coordination normal.   Psychiatric:         Mood and Affect: Mood normal.         Behavior: Behavior  normal.         Thought Content: Thought content normal.         Judgment: Judgment normal.              MDM   Number of Diagnoses or Management Options   Gastroesophageal reflux disease, unspecified whether esophagitis present: new and requires workup   Diagnosis management comments: Differential diagnosis includes GERD, MI, pancreatitis and others.      After physical assessment and review of laboratory data, patient was discharged home and will follow up with PCP.  Return to the emergency room with worsening symptoms.  Patient in agreement with plan of care.             Amount and/or Complexity of Data Reviewed   Clinical lab tests: ordered and reviewed               Labs Reviewed       METABOLIC PANEL, COMPREHENSIVE - Abnormal; Notable for the following  components:            Result  Value            Glucose  106 (*)         BUN/Creatinine ratio  21 (*)            All other components within normal limits       SAMPLES BEING HELD     CBC WITH AUTOMATED DIFF     LIPASE     TROPONIN-HIGH SENSITIVITY       URINALYSIS W/MICROSCOPIC        No results found.   8:02 PM   Pt has been reexamined.  Pt has no new complaints, changes or physical findings. Care plan outlined and precautions discussed. All available results were reviewed with pt. All medications were reviewed with pt. All of pt's questions and concerns were  addressed. Pt agrees to F/U as instructed and agrees to return to ED upon further deterioration. Pt is ready to go home.   Magnus SinningKathleen M Philopater Mucha, NP      Please note that this dictation was completed with Dragon, the computer voice recognition software.  Quite often unanticipated grammatical, syntax, homophones, and other interpretive errors are inadvertently transcribed by the computer software.  Please  disregard these errors.  Please excuse any errors that have escaped final proofreading.  Thank you.      Procedures

## 2020-07-15 NOTE — ED Notes (Signed)
Spanish Interpreter: [phone]    Pt with chest pain for the past couple of wks and to the center of her back. Pt reports sob off and on.

## 2020-07-16 MED ORDER — PANTOPRAZOLE 40 MG TAB, DELAYED RELEASE
40 mg | ORAL_TABLET | Freq: Every day | ORAL | 0 refills | Status: DC
Start: 2020-07-16 — End: 2020-08-12

## 2020-07-16 MED FILL — MAG-AL PLUS 200 MG-200 MG-20 MG/5 ML ORAL SUSPENSION: 200-200-20 mg/5 mL | ORAL | Qty: 30

## 2020-07-17 LAB — EKG, 12 LEAD, INITIAL
Atrial Rate: 79 {beats}/min
Calculated P Axis: 36 degrees
Calculated R Axis: 4 degrees
Calculated T Axis: 18 degrees
Diagnosis: NORMAL
P-R Interval: 146 ms
Q-T Interval: 396 ms
QRS Duration: 86 ms
QTC Calculation (Bezet): 454 ms
Ventricular Rate: 79 {beats}/min

## 2020-07-17 LAB — EKG 12-LEAD
Atrial Rate: 79 {beats}/min
Diagnosis: NORMAL
P Axis: 36 degrees
P-R Interval: 146 ms
Q-T Interval: 396 ms
QRS Duration: 86 ms
QTc Calculation (Bazett): 454 ms
R Axis: 4 degrees
T Axis: 18 degrees
Ventricular Rate: 79 {beats}/min

## 2020-07-21 ENCOUNTER — Ambulatory Visit: Attending: Family Medicine | Primary: Family Medicine

## 2020-07-21 ENCOUNTER — Ambulatory Visit: Admit: 2020-07-21 | Discharge: 2020-07-21 | Attending: Family Medicine | Primary: Family Medicine

## 2020-07-21 DIAGNOSIS — M549 Dorsalgia, unspecified: Secondary | ICD-10-CM

## 2020-07-21 MED ORDER — CYCLOBENZAPRINE 10 MG TAB
10 mg | ORAL_TABLET | Freq: Every evening | ORAL | 0 refills | Status: AC
Start: 2020-07-21 — End: 2020-08-04

## 2020-07-21 NOTE — Progress Notes (Signed)
1. Have you been to the ER, urgent care clinic since your last visit?  Hospitalized since your last visit?Yes When: 6/21[facility], [place]/heartburn      2. Have you seen or consulted any other health care providers outside of the [facility] since your last visit?  Include any pap smears or colon screening. No

## 2020-07-21 NOTE — Progress Notes (Signed)
Katherine Patterson is a 30 y.o. female   Chief Complaint   Patient presents with   ??? GERD     ER visit 6/21 for cp, heartburn         ASSESSMENT AND PLAN:    1. Upper back pain  2. Neck pain  Upper back and neck tenderness and trigger points on exam.  Course of muscle relaxant.  Follow up in 2 weeks.  - cyclobenzaprine (FLEXERIL) 10 mg tablet; Take 0.5 Tablets by mouth nightly for 14 days. Indications: muscle spasm  Dispense: 7 Tablet; Refill: 0    3. Other chest pain  Suspect multifactorial - GERD, MSK, anxiety.    4. Gastroesophageal reflux disease, unspecified whether esophagitis present  Continue Protonix.  Take fasting in the morning before breakfast.  Check H Pylori.    - H PYLORI AG, STOOL; Future      SUBJECTIVE:    HPI:  Katherine Patterson is a 30 y.o. female who presents after an ED visit for chest pain on 6/21.  She had labwork including CBC, CMP, Lipase and troponins which were negative. Her EKG was normal.  She was discharged with protonix which she has been taking in the afternoon.  She hasn't noticed a big difference yet.  She states she has suffered from gastritis since she was 30yo and it has always responded to medications.  This is a little different that usual.  Her symptoms are not affected by food.  She admits it might be gas because sometimes if she bumps her chest or her children thump her back she'll burp and it'll creat a little relief.  Denies N/V/D/C. No abdominal pain.    Sometimes it starts as a tingling sensation of her upper back and neck that radiates to her sternum. Other times it feels like it starts in her sternum and radiates to her back . She's had this for 10 years, but it happened rarely, every few months.  Last week she had had it daily for 1.5 weeks, 3.5x/day.  IT lasts about an hour.  When it gets to an 8 or 9/10 she has shortness of breath.    She admits to occasional headaches, fatigue and some anxiety. No dizziness.        Review of Systems   Constitutional:  Positive for malaise/fatigue. Negative for fever.   Eyes: Negative for blurred vision.   Respiratory: Negative for cough and shortness of breath.    Cardiovascular: Positive for chest pain. Negative for palpitations and leg swelling.   Gastrointestinal: Positive for heartburn. Negative for abdominal pain, constipation, diarrhea, nausea and vomiting.   Musculoskeletal: Positive for back pain, myalgias and neck pain.   Neurological: Positive for headaches. Negative for dizziness.   Psychiatric/Behavioral: The patient is nervous/anxious.          BP 111/69 (BP 1 Location: Left upper arm, BP Patient Position: Sitting)    Pulse 72    Temp 98 ??F (36.7 ??C) (Temporal)    Resp 18    Ht 4' 11.49" (1.511 m)    Wt 149 lb (67.6 kg)    LMP 07/05/2020    SpO2 100%    BMI 29.60 kg/m??     Physical Exam  Constitutional:       General: She is not in acute distress.     Appearance: Normal appearance.   Eyes:      Extraocular Movements: Extraocular movements intact.      Conjunctiva/sclera: Conjunctivae normal.  Pupils: Pupils are equal, round, and reactive to light.   Cardiovascular:      Rate and Rhythm: Normal rate and regular rhythm.      Pulses: Normal pulses.      Heart sounds: Normal heart sounds.   Pulmonary:      Effort: Pulmonary effort is normal.      Breath sounds: Normal breath sounds.   Musculoskeletal:      Cervical back: Tenderness present. Normal range of motion.      Thoracic back: Spasms and tenderness present.   Neurological:      Mental Status: She is alert.

## 2020-07-21 NOTE — Progress Notes (Signed)
An After Visit Summary was printed and given to the patient. Discharge medications reviewed with patient and appropriate educational materials and side effects teaching were provided. Goodrx coupon provided and RN explained use. RN explained h pylori test including collection and when to bring back to clinic. Time for questions and answers provided, patient verbalized understanding. Patient discharged from clinic in stable condition. CVAN interpreter Rolly Salter assisted with this d/c.

## 2020-07-22 ENCOUNTER — Other Ambulatory Visit: Admit: 2020-07-22 | Discharge: 2020-07-22 | Primary: Family Medicine

## 2020-07-22 DIAGNOSIS — K219 Gastro-esophageal reflux disease without esophagitis: Secondary | ICD-10-CM

## 2020-07-22 NOTE — Progress Notes (Signed)
Patient dropped off H. Pylori specimen.

## 2020-07-22 NOTE — Progress Notes (Signed)
Negative H Pylori. Will discuss with patient at followup.

## 2020-07-23 ENCOUNTER — Inpatient Hospital Stay: Admit: 2020-07-23 | Primary: Family Medicine

## 2020-07-25 LAB — H PYLORI AG, STOOL
H. Pylori Stool Ag,EIA: NEGATIVE
H. pylori Ag, stool, EIA: NEGATIVE

## 2020-08-12 ENCOUNTER — Ambulatory Visit: Attending: Family Medicine | Primary: Family Medicine

## 2020-08-12 ENCOUNTER — Ambulatory Visit: Admit: 2020-08-12 | Discharge: 2020-08-12 | Attending: Family Medicine | Primary: Family Medicine

## 2020-08-12 DIAGNOSIS — K219 Gastro-esophageal reflux disease without esophagitis: Secondary | ICD-10-CM

## 2020-08-12 MED ORDER — CYCLOBENZAPRINE 10 MG TAB
10 mg | ORAL_TABLET | Freq: Every evening | ORAL | 0 refills | Status: DC | PRN
Start: 2020-08-12 — End: 2021-04-24

## 2020-08-12 MED ORDER — PANTOPRAZOLE 40 MG TAB, DELAYED RELEASE
40 mg | ORAL_TABLET | Freq: Every day | ORAL | 2 refills | Status: DC
Start: 2020-08-12 — End: 2021-04-24

## 2020-08-12 NOTE — Progress Notes (Signed)
 Chief Complaint   Patient presents with   . Follow-up     gastritis; f/up after starting protonix; pt states it helps - denies n/v, reflux, diarrhea     Coordination of Care  1. Have you been to the ER, urgent care clinic since your last visit?  Hospitalized since your last visit? No    2. Have you seen or consulted any other health care providers outside of the [facility] since your last visit?  Include any pap smears or colon screening. No    Does the patient need refills? YES states only 3 pills left of Protonix    Learning Assessment Complete? yes  Depression Screening complete in the past 12 months? yes

## 2020-08-12 NOTE — Progress Notes (Signed)
Katherine Patterson is a 30 y.o. female   Chief Complaint   Patient presents with   ??? Follow-up     gastritis; f/up after starting protonix; pt states it helps - denies n/v, reflux, diarrhea         ASSESSMENT AND PLAN:    1. Gastroesophageal reflux disease, unspecified whether esophagitis present  Improved with pantoprazole. Continue.  Avoid dietary triggers.  Follow up in 3 months.  - pantoprazole (Protonix) 40 mg tablet; Take 1 Tablet by mouth daily.  Dispense: 30 Tablet; Refill: 2    2. Upper back pain  Improved with flexeril course, tension returning. Discussed stretches.  Still has a lg knot/trigger point in left upper trap.  Discussed Flexeril as needed for severe tension/pain.    - cyclobenzaprine (FLEXERIL) 10 mg tablet; Take 0.5 Tablets by mouth nightly as needed for Muscle Spasm(s). Indications: muscle spasm  Dispense: 10 Tablet; Refill: 0      SUBJECTIVE:    HPI:  Katherine Patterson is a 30 y.o. female who presents for followup. She was seen 6/27 with epigastric and chest pain after being seen in the ED for chest pain with a negative workup. She was alos having upper back and neck pain.    Pt reports symptoms are much better with Protonix.  She has had about 4 episodes lasting 15-20 minutes of that chest pain in the past month and it was mild.    After day 2 of the flexeril course she could feel a big difference in her pain and neck tension.  Since completing the course she has noticed a gradual return in the tension and she understands it's due to stress.    No new concerns.  Due for pap - last one about 3 years ago.    Review of Systems   Constitutional: Negative for fever and malaise/fatigue.   Eyes: Negative for blurred vision.   Respiratory: Negative for cough and shortness of breath.    Cardiovascular: Positive for chest pain (noncardiac, improving with protonix). Negative for palpitations and leg swelling.   Gastrointestinal: Positive for heartburn (improving). Negative for abdominal  pain, constipation, diarrhea, nausea and vomiting.   Musculoskeletal: Positive for myalgias and neck pain.   Neurological: Negative for dizziness and headaches.         BP 103/61 (BP 1 Location: Left arm, BP Patient Position: Sitting)    Pulse 94    Temp 98.8 ??F (37.1 ??C) (Oral)    Resp 16    Ht 4' 11.49" (1.511 m)    Wt 147 lb 12.8 oz (67 kg)    LMP 08/09/2020 (Exact Date)    SpO2 98%    BMI 29.36 kg/m??     Physical Exam  Constitutional:       General: She is not in acute distress.     Appearance: Normal appearance.   Eyes:      Extraocular Movements: Extraocular movements intact.      Conjunctiva/sclera: Conjunctivae normal.      Pupils: Pupils are equal, round, and reactive to light.   Neck:      Comments: Trigger point left upper trap, overall muscle tension greatly improved.  Cardiovascular:      Rate and Rhythm: Normal rate and regular rhythm.      Heart sounds: Normal heart sounds.   Pulmonary:      Effort: Pulmonary effort is normal.      Breath sounds: Normal breath sounds.   Neurological:  Mental Status: She is alert.

## 2020-08-12 NOTE — Progress Notes (Signed)
Name and DOB confirmed w/ patient. An After Visit Summary was provided and all discharge instructions were reviewed with the patient including: f/up appt and refills. Time for questions and answers provided, patient verbalized understanding. Patient discharged from clinic in stable condition. AMN interpreter [phone] assisted with this d/c.

## 2020-08-26 ENCOUNTER — Telehealth: Attending: Family Medicine | Primary: Family Medicine

## 2020-08-26 ENCOUNTER — Telehealth: Admit: 2020-08-26 | Discharge: 2020-08-26 | Attending: Family Medicine | Primary: Family Medicine

## 2020-08-26 ENCOUNTER — Encounter: Attending: Medical | Primary: Family Medicine

## 2020-08-26 DIAGNOSIS — G44219 Episodic tension-type headache, not intractable: Secondary | ICD-10-CM

## 2020-08-26 MED ORDER — AMOXICILLIN CLAVULANATE 875 MG-125 MG TAB
875-125 mg | ORAL_TABLET | Freq: Two times a day (BID) | ORAL | 0 refills | Status: AC
Start: 2020-08-26 — End: 2020-09-02

## 2020-08-26 NOTE — Progress Notes (Signed)
Katherine Patterson was spoken to at the time of virtual discharge. Full name and DOB verified. The After Visit Summary was reviewed along with today's consult to ensure all questions were answered. Instructions were reviewed as well as when it is recommended to come back to our clinic for another appointment. I reviewed the medication list with the patient to ensure she knows how to and when to take her medications. Side effects, mechanisms of action and medication compliance were reiterated to ensure proper understanding.  Good Rx coupons given/texted to patient as well as instructions on how to use at the pharmacy.  I have reviewed the provider's instructions with the patient, answering all questions to her satisfaction. Patient verbalized understanding.  Levonne Lapping, RN

## 2020-08-26 NOTE — Progress Notes (Signed)
 Chief Complaint   Patient presents with    Headache     1.5 weeks with severe headache. New Onset. Lasts all day and is intermittent. Has taken Tylenol or Ibuprofen with no relief. Mostly ''frontal pain''. Rates pain 9/10.        1. Have you been to the ER, urgent care clinic since your last visit?  Hospitalized since your last visit? No    2. Have you seen or consulted any other health care providers outside of the [facility] since your last visit? No     3. For patients aged 31-75: Has the patient had a colonoscopy / FIT/ Cologuard? NA - based on age      If the patient is female:    4. For patients aged 86-74: Has the patient had a mammogram within the past 2 years? NA - based on age or sex      44. For patients aged 21-65: Has the patient had a pap smear? No

## 2020-08-26 NOTE — Progress Notes (Signed)
Katherine Patterson (DOB: Dec 14, 1990) is a 30 y.o. female, established patient, Virtual Visit for evaluation of the following chief complaint(s):   Headache (1.5 weeks with severe headache. New Onset. Lasts all day and is intermittent. Has taken Tylenol or Ibuprofen with no relief. Mostly ''frontal pain''. Rates pain 9/10. )       ASSESSMENT/PLAN:  1. Episodic tension-type headache, not intractable  Severe spells of headaches every few months.  Have been associated with sinusitis and Vitamin D deficiency in past.  DDx would include migraine headaches or cluster migraines.  Will give trial of Augmentin and if not improved would consider trying a migraine medication.    No follow-ups on file.    SUBJECTIVE/OBJECTIVE:  Headache:  2 weeks of frontal headache, variable but daily,  can resolve but then can last all day.  Mild relief with Tylenol, Ibuprofen.  Associated dizziness.  No visual changes, cough, ST, fevers.  Has had prior episodic severe headaches, from chart review.    Vitamin D deficiency:  patient is taking her supplement regularly.    Review of Systems   Constitutional:  Negative for chills, diaphoresis and fever.   HENT:  Negative for sore throat.    Eyes:  Negative for visual disturbance.   Musculoskeletal:  Positive for arthralgias.   Neurological:  Positive for dizziness and headaches.    AM joint pain.    No data recorded    Physical Exam    On this date 08/26/2020 I have spent 11 minutes reviewing previous notes, test results and face to face (virtual) with the patient discussing the diagnosis and importance of compliance with the treatment plan as well as documenting on the day of the visit.  Katherine Patterson, was evaluated through a synchronous (real-time) audio-video encounter. The patient (or guardian if applicable) is aware that this is a billable service, which includes applicable co-pays. This Virtual Visit was conducted with patient's (and/or legal guardian's) consent. The visit was  conducted pursuant to the emergency declaration under the [employer] and the [employer], 1135 waiver authority and the [employer] and [employer] Act.  Patient identification was verified, and a caregiver was present when appropriate.  The patient was located at: Home: [address] [facility]  [place] [address]  The provider was located at: Home: @[email or web address] @     Patient identification was verified at the start of the visit: YES    Services were provided through a phone synchronous discussion virtually to substitute for in-person clinic visit. Patient was located at home and provider was located in office or at home.     An electronic signature was used to authenticate this note.  -- , MD

## 2020-09-30 ENCOUNTER — Ambulatory Visit: Attending: Medical | Primary: Family Medicine

## 2020-09-30 ENCOUNTER — Ambulatory Visit: Admit: 2020-09-30 | Discharge: 2020-09-30 | Attending: Medical | Primary: Family Medicine

## 2020-09-30 DIAGNOSIS — N3000 Acute cystitis without hematuria: Secondary | ICD-10-CM

## 2020-09-30 LAB — AMB POC URINALYSIS DIP STICK MANUAL W/O MICRO
Bilirubin (UA POC): NEGATIVE
Bilirubin, Urine, POC: NEGATIVE
Glucose (UA POC): NEGATIVE
Glucose, Urine, POC: NEGATIVE
Ketones (UA POC): NEGATIVE
Ketones, Urine, POC: NEGATIVE
Nitrite, Urine, POC: NEGATIVE
Nitrites (UA POC): NEGATIVE
Protein (UA POC): NEGATIVE
Protein, Urine, POC: NEGATIVE
Specific Gravity, Urine, POC: 1.005 NA (ref 1.001–1.035)
Specific gravity (UA POC): 1.005 (ref 1.001–1.035)
Urobilinogen (UA POC): 0.2 (ref 0.2–1)
Urobilinogen, POC: 0.2 (ref 0.2–1)
pH (UA POC): 6 (ref 4.6–8.0)
pH, Urine, POC: 6 NA (ref 4.6–8.0)

## 2020-09-30 MED ORDER — NITROFURANTOIN (25% MACROCRYSTAL FORM) 100 MG CAP
100 mg | ORAL_CAPSULE | Freq: Two times a day (BID) | ORAL | 0 refills | Status: AC
Start: 2020-09-30 — End: 2021-04-24

## 2020-09-30 NOTE — Progress Notes (Signed)
Chief Complaint   Patient presents with    Dysuria     For more than a week; Pain before and after urination; denies flank pain, n/v, diarrhea, hematuria;     Urinary Frequency     Urge to go often but does not produce much urine     Visit Vitals  BP 95/62 (BP 1 Location: Left upper arm, BP Patient Position: Sitting)   Pulse 75   Temp 97.9 F (36.6 C) (Temporal)   Resp 18   Wt 146 lb 3.2 oz (66.3 kg)   LMP 09/18/2020 (Exact Date)   SpO2 98%   BMI 29.05 kg/m     Results for orders placed or performed in visit on 09/30/20   AMB POC URINALYSIS DIP STICK MANUAL W/O MICRO   Result Value Ref Range    Color (UA POC) Amber     Clarity (UA POC) Clear     Glucose (UA POC) Negative Negative    Bilirubin (UA POC) Negative Negative    Ketones (UA POC) Negative Negative    Specific gravity (UA POC) 1.005 1.001 - 1.035    Blood (UA POC) Trace Negative    pH (UA POC) 6.0 4.6 - 8.0    Protein (UA POC) Negative Negative    Urobilinogen (UA POC) 0.2 mg/dL 0.2 - 1    Nitrites (UA POC) Negative Negative    Leukocyte esterase (UA POC) 1+ Negative       Coordination of Care  1. Have you been to the ER, urgent care clinic since your last visit?  Hospitalized since your last visit? No    2. Have you seen or consulted any other health care providers outside of the [facility] since your last visit?  Include any pap smears or colon screening. No    Does the patient need refills? YES Pantoprazole    Learning Assessment Complete? yes  Depression Screening complete in the past 12 months? yes

## 2020-09-30 NOTE — Progress Notes (Signed)
An After Visit Summary was printed and given to the patient. Instructed patient on how to administer medication per provider order.

## 2020-09-30 NOTE — Progress Notes (Signed)
Assessment/Plan:    Diagnoses and all orders for this visit:    1. Acute cystitis without hematuria  -     AMB POC URINALYSIS DIP STICK MANUAL W/O MICRO  -     nitrofurantoin, macrocrystal-monohydrate, (Macrobid) 100 mg capsule; Take 1 Capsule by mouth two (2) times a day.        Follow-up and Dispositions    Return if symptoms worsen or fail to improve.         [place] Marykate Heuberger, PA-C  [name] expressed understanding of this plan. An AVS was printed and given to the patient.      ----------------------------------------------------------------------    Chief Complaint   Patient presents with    Dysuria     For more than a week; Pain before and after urination; denies flank pain, n/v, diarrhea, hematuria;     Urinary Frequency     Urge to go often but does not produce much urine       History of Present Illness:    30 yo female presents with 7 days of urinary pain, burning with urination, incomplete emptying sensation, urgency and frequency  No fever  No blood in urine  No allergies to abx    No past medical history on file.    Current Outpatient Medications   Medication Sig Dispense Refill    nitrofurantoin, macrocrystal-monohydrate, (Macrobid) 100 mg capsule Take 1 Capsule by mouth two (2) times a day. 14 Capsule 0    pantoprazole (Protonix) 40 mg tablet Take 1 Tablet by mouth daily. 30 Tablet 2    cyclobenzaprine (FLEXERIL) 10 mg tablet Take 0.5 Tablets by mouth nightly as needed for Muscle Spasm(s). Indications: muscle spasm 10 Tablet 0       No Known Allergies    Social History     Tobacco Use    Smoking status: Never    Smokeless tobacco: Never   Vaping Use    Vaping Use: Never used   Substance Use Topics    Alcohol use: Not Currently    Drug use: Never       No family history on file.    Physical Exam:     Visit Vitals  BP 95/62 (BP 1 Location: Left upper arm, BP Patient Position: Sitting)   Pulse 75   Temp 97.9 ??F (36.6 ??C) (Temporal)   Resp 18   Wt 146 lb 3.2 oz (66.3 kg)   LMP 09/18/2020  (Exact Date)   SpO2 98%   BMI 29.05 kg/m??       A&Ox3  WDWN NAD  Respirations normal and non labored    Results for orders placed or performed in visit on 09/30/20   AMB POC URINALYSIS DIP STICK MANUAL W/O MICRO     Status: None   Result Value Ref Range Status    Color (UA POC) Amber  Final    Clarity (UA POC) Clear  Final    Glucose (UA POC) Negative Negative Final    Bilirubin (UA POC) Negative Negative Final    Ketones (UA POC) Negative Negative Final    Specific gravity (UA POC) 1.005 1.001 - 1.035 Final    Blood (UA POC) Trace Negative Final    pH (UA POC) 6.0 4.6 - 8.0 Final    Protein (UA POC) Negative Negative Final    Urobilinogen (UA POC) 0.2 mg/dL 0.2 - 1 Final    Nitrites (UA POC) Negative Negative Final    Leukocyte esterase (UA POC) 1+  Negative Final

## 2020-10-14 ENCOUNTER — Ambulatory Visit: Attending: Medical | Primary: Family Medicine

## 2020-10-14 ENCOUNTER — Inpatient Hospital Stay: Admit: 2020-10-15 | Primary: Family Medicine

## 2020-10-14 ENCOUNTER — Ambulatory Visit: Admit: 2020-10-14 | Discharge: 2020-10-14 | Attending: Medical | Primary: Family Medicine

## 2020-10-14 DIAGNOSIS — Z01419 Encounter for gynecological examination (general) (routine) without abnormal findings: Secondary | ICD-10-CM

## 2020-10-14 NOTE — Progress Notes (Signed)
A negative lab letter was created and sent to the queue. Felizardo Hoffmann, RN

## 2020-10-14 NOTE — Progress Notes (Signed)
Coordination of Care  1. Have you been to the ER, urgent care clinic since your last visit?  Hospitalized since your last visit? No    2. Have you seen or consulted any other health care providers outside of the [facility] since your last visit?  Include any pap smears or colon screening. No    Does the patient need refills? NO    Learning Assessment Complete? yes  Depression Screening complete in the past 12 months? yes  [facility]    When was your last pap smear and where? 2019 [place]    Any abnormal results from previous pap smears? Normal     Any problems with your breasts? no    Any family history of breast/ovarian cancer? no    Do you have any kids? yes    Oldest 7 youngest 3    Are you sexually active? no    Are you using any type of birth control? If yes where do you go for this? no    Abuse screening completed this visit? yes        CMA printed and reviewed AVS with patient per provider instructions.  No special instructions.  This has been fully explained to the patient, who indicates understanding.

## 2020-10-14 NOTE — Progress Notes (Signed)
Assessment/Plan:    Diagnoses and all orders for this visit:    1. Encounter for well woman exam  -     PAP IG, APTIMA HPV AND RFX 16/18,45 ([id number]); Future            [place]ali Timon Geissinger, PA-C  [name] expressed understanding of this plan. An AVS was printed and given to the patient.      ----------------------------------------------------------------------    Chief Complaint   Patient presents with    Well Woman     PAP smear exam, routine no issues         History of Present Illness:  Pt presents for well woman exam  Last pap 3 years ago in [place], no hx of abnl pap  In a safe relationship, denies risk of DV  G2p2 c-section  Has normal monthly periods, not on birth control husband uses condoms          No past medical history on file.    Current Outpatient Medications   Medication Sig Dispense Refill    pantoprazole (Protonix) 40 mg tablet Take 1 Tablet by mouth daily. 30 Tablet 2    cyclobenzaprine (FLEXERIL) 10 mg tablet Take 0.5 Tablets by mouth nightly as needed for Muscle Spasm(s). Indications: muscle spasm 10 Tablet 0    nitrofurantoin, macrocrystal-monohydrate, (Macrobid) 100 mg capsule Take 1 Capsule by mouth two (2) times a day. (Patient not taking: Reported on 10/14/2020) 14 Capsule 0       No Known Allergies    Social History     Tobacco Use    Smoking status: Never    Smokeless tobacco: Never   Vaping Use    Vaping Use: Never used   Substance Use Topics    Alcohol use: Not Currently    Drug use: Never       No family history on file.    Physical Exam:     Visit Vitals  BP 98/66 (BP 1 Location: Left upper arm, BP Patient Position: Sitting)   Pulse 78   Temp 97 ??F (36.1 ??C) (Temporal)   Ht 4' 11.49" (1.511 m)   Wt 145 lb (65.8 kg)   LMP 09/18/2020 (Exact Date)   SpO2 100%   BMI 28.81 kg/m??       A&Ox3  WDWN NAD  Respirations normal and non labored    Pelvic exam- ext neg for lesion or discharge. Cervix and vagina w/out lesion or discharge. Uterus and adnexal exam neg for mass or  tenderness

## 2020-10-14 NOTE — Progress Notes (Signed)
Please send result letter: neg cytology/ neg HPV = 5 year f/up pap 2027 due

## 2021-02-19 ENCOUNTER — Ambulatory Visit: Primary: Family Medicine

## 2021-02-19 ENCOUNTER — Ambulatory Visit: Admit: 2021-02-19 | Discharge: 2021-02-19 | Primary: Family Medicine

## 2021-02-19 DIAGNOSIS — Z7189 Other specified counseling: Secondary | ICD-10-CM

## 2021-02-19 NOTE — Progress Notes (Signed)
 Patient was a walk-in. Patient stated to her child's provider that she does not feel safe at home. OW has provided resources for food (score 3) and resources for [facility] for her daughters. [facility] has a program for children which her daughters may be able to take advantage of. Mom also mentioned  she might register for their ESL program. Resources have been provided. Patient has been advised to call OW if she needs any further assistance. Patient has verbalized understanding. Also, a staff message has been sent to Hadassah CROME., Patient stated she would like to see a counselor.

## 2021-02-24 NOTE — Telephone Encounter (Signed)
Per Francesca Jewett, OW, staff message received on 03/22/21, I contacted Tawana Scale to schedule an initial consult with the counselor. Patient is self-referred. The appt was scheduled and all the appt information was provided. Additionally, a reminder appt letter was mailed.    Patient verbalized understanding and had no further questions.    P/C routed to [facility], LCSW    Erma Pinto

## 2021-03-24 NOTE — Telephone Encounter (Signed)
 OW called patient and was unable to speak with patient or leave a vm. Mail box has not been set up yet.

## 2021-04-14 ENCOUNTER — Ambulatory Visit: Admit: 2021-04-14 | Discharge: 2021-04-15 | Attending: Clinical | Primary: Family Medicine

## 2021-04-14 DIAGNOSIS — F4323 Adjustment disorder with mixed anxiety and depressed mood: Secondary | ICD-10-CM

## 2021-04-23 NOTE — Telephone Encounter (Signed)
SDOH f/up: Patient has been going to [facility] bi-weekly. (Score 5)

## 2021-04-24 ENCOUNTER — Ambulatory Visit: Admit: 2021-04-24 | Discharge: 2021-04-24 | Attending: Family Medicine | Primary: Family Medicine

## 2021-04-24 DIAGNOSIS — G44219 Episodic tension-type headache, not intractable: Secondary | ICD-10-CM

## 2021-04-24 MED ORDER — CYCLOBENZAPRINE 10 MG TAB
10 mg | ORAL_TABLET | Freq: Every evening | ORAL | 0 refills | Status: AC | PRN
Start: 2021-04-24 — End: ?

## 2021-04-24 MED ORDER — CITALOPRAM 10 MG TAB
10 mg | ORAL_TABLET | Freq: Every day | ORAL | 1 refills | Status: AC
Start: 2021-04-24 — End: ?

## 2021-04-24 MED ORDER — PANTOPRAZOLE 40 MG TAB, DELAYED RELEASE
40 mg | ORAL_TABLET | Freq: Every day | ORAL | 0 refills | Status: AC
Start: 2021-04-24 — End: ?

## 2021-04-24 NOTE — Progress Notes (Signed)
Coordination of Care  1. Have you been to the ER, urgent care clinic since your last visit?  Hospitalized since your last visit? No    2. Have you seen or consulted any other health care providers outside of the [facility] since your last visit?  Include any pap smears or colon screening. No    Does the patient need refills? NO    Learning Assessment Complete? yes  Depression Screening complete in the past 12 months? yes    Chief Complaint   Patient presents with    Headache     Patient c/o headache x 2 weeks, 5/10 on pain scale. Patient takes tylenol which helps momentarily, but headache returns. Pain is localized on forehead, described as pressure. No reported injuries.    Abdominal Pain     C/o abdominal pain x 3 days. Pain is intermittent on right and left lumbar region. pt. notices bloating after eating. Patient has daily bowel movements.

## 2021-04-24 NOTE — Progress Notes (Signed)
Katherine Patterson seen at d/c, full name and DOB verified, given After visit Summary and reviewed today's visit with patient along with instructions on when it is recommended to come back.  I reviewed the medication list with the patient to ensure she knows how to and when to take her medications. Side effects, mechanisms of action and medication compliance were reiterated to ensure proper understanding.  Good Rx coupons given/texted to patient as well as instructions on how to use at the pharmacy.  I have reviewed the provider's instructions with the patient, answering all questions to her satisfaction. Patient verbalized understanding.  Levonne Lapping, RN

## 2021-04-24 NOTE — Progress Notes (Signed)
Katherine Patterson (DOB: [birth year]) is a 31 y.o. female, established patient, here for evaluation of the following chief complaint(s):  Headache (Patient c/o headache x 2 weeks, 5/10 on pain scale. Patient takes tylenol which helps momentarily, but headache returns. Pain is localized on forehead, described as pressure. No reported injuries.) and Abdominal Pain (C/o abdominal pain x 3 days. Pain is intermittent on right and left lumbar region. pt. notices bloating after eating. Patient has daily bowel movements. )       ASSESSMENT/PLAN:  1. Episodic tension-type headache, not intractable  With increased stress and mild reactive depressive sx.  Supportive therapy given.  Follow up with Steward Drone.  Will try low dose SSRI for headache prevention.  2. Gastroesophageal reflux disease, unspecified whether esophagitis present  Resume Protonix PRN.  -     pantoprazole (Protonix) 40 mg tablet; Take 1 Tablet by mouth daily., Normal, Disp-30 Tablet, R-0  3. Upper back pain  Upper back tension on exam.  This can contribute to headaches.  She feels Flexeril helped and so will renew.  -     cyclobenzaprine (FLEXERIL) 10 mg tablet; Take 0.5 Tablets by mouth nightly as needed for Muscle Spasm(s). Indications: muscle spasm, Normal, Disp-30 Tablet, R-0    Follow-up and Dispositions    Return for follow up for VV in 2 weeks for headaches.         SUBJECTIVE:  Headache:  2 weeks of frontal headache, variable but daily, can resolve with Tylenol but recurs.  Located along forehead but with no congestion, sinus pressure, eye pain, vision changes.  No dizziness, visual changes, cough, ST, fevers.  Hx prior episodic severe headaches, from chart review.  Upper abdominal pain/bloating:  x 3 days, worse after eating.  Is no longer taking Protonix.  H.Pylori 06/2020: negative.  Vitamin D deficiency:  patient is taking OTC 2,000 units daily.    Shx: Feels stress could contribute to her current physical sx.  She left her partner and is now a  single mother of 2 girls without any other support.  Estranged spouse is not helping financially.  Her job cut her time back to just 3 days a week and so she is worries about finances.  She feels stress is making her having more anhedonia, feeling like crying, feeling like running away.  No SI.    Review of Systems   Constitutional:  Negative for unexpected weight change.   HENT:  Negative for trouble swallowing.    Gastrointestinal:  Negative for diarrhea, nausea and vomiting.     OBJECTIVE:  Blood pressure 97/61, pulse 67, temperature 98.7 F (37.1 C), temperature source Oral, height 4\' 11"  (1.499 m), weight 135 lb 9.6 oz (61.5 kg), last menstrual period 03/28/2021, SpO2 99 %.  Physical Exam  CONSTITUTIONAL:  Well developed.  No apparent distress.  PSYCHIATRIC: Oriented to time, place, person & situation.  Appropriate mood and affect.  HEENT:  Sclera clear.  PERRL.  Throat without PND or erythema.  NECK:  Normal inspection, normal palpation without any lymphadenopathy, masses, or thyromegaly.  Tender muscles along B lateral trapezius.  CARDIOVASCULAR:  Regular rate and rhythm.  Normal S1, S2.  No extra sounds.  RESPIRATORY:  Normal effort.  Normal ascultation without wheezing.  ABDOMEN:  Normal bowel sounds.  Abdomen soft, very mild epigastric tenderness.  No hepatosplenomegaly or masses.  VASCULAR:  Normal Carotid pulses without bruits.    EXTREMITIES:  No edema.    No results found for this visit on 04/24/21.  An electronic signature was used to authenticate this note.  -- Terrilee Croak, MD

## 2021-05-12 ENCOUNTER — Telehealth: Attending: Family Medicine | Primary: Family Medicine

## 2021-05-12 ENCOUNTER — Telehealth: Admit: 2021-05-12 | Discharge: 2021-05-12 | Attending: Family Medicine | Primary: Family Medicine

## 2021-05-12 DIAGNOSIS — G44219 Episodic tension-type headache, not intractable: Secondary | ICD-10-CM

## 2021-05-12 MED ORDER — PANTOPRAZOLE 40 MG TAB, DELAYED RELEASE
40 mg | ORAL_TABLET | Freq: Every day | ORAL | 0 refills | Status: AC
Start: 2021-05-12 — End: ?

## 2021-05-12 MED ORDER — CYCLOBENZAPRINE 10 MG TAB
10 mg | ORAL_TABLET | Freq: Every evening | ORAL | 0 refills | Status: AC | PRN
Start: 2021-05-12 — End: ?

## 2021-05-12 NOTE — Progress Notes (Signed)
I called the pt with the interpreter Sampson Si, for discharge. The pt verified her name and dob. Told pt that rx's have been sent to pharmacy, and they should be ready for pick up in approximately 2 hrs. I told the pt to please present [email or web address] coupon which we provided via text to your pharmacy to receive discounted price. We reviewed the medications with the pt. Felizardo Hoffmann, RN

## 2021-05-12 NOTE — Progress Notes (Signed)
Katherine Patterson (DOB: [birth year]) is a 31 y.o. female, established patient, Virtual Visit for evaluation of the following chief complaint(s):   Headache (Follow up for headaches. )       ASSESSMENT/PLAN:  1. Episodic tension-type headache, not intractable  I would like her to take Flexeril 1/2 tab every evening since this helped after last visit.  Recheck in 2 weeks.  2. Adjustment disorder with mixed anxiety and depressed mood  Continue with Celexa and follow up with Steward Drone  3. Gastroesophageal reflux disease, unspecified whether esophagitis present  Still having breakthrough sx.  Consider IBS.  Will continue with Protonix and consider Levsin trial of follow up.   -     pantoprazole (Protonix) 40 mg tablet; Take 1 Tablet by mouth daily., Normal, Disp-30 Tablet, R-0  4. Upper back pain  -     cyclobenzaprine (FLEXERIL) 10 mg tablet; Take 0.5 Tablets by mouth nightly as needed for Muscle Spasm(s). Indications: muscle spasm, Normal, Disp-30 Tablet, R-0      Return for follow up for F2F in 2 weeks for headaches, abdominal bloating.    SUBJECTIVE/OBJECTIVE:  VV for follow up.  Headache:  Headaches resolved after last visit but restarted 4 days ago.  It is forehead and top of head.  Patient is taking Celexa daily which she feels is helping.  She is taking Flexeril PRN when her upper back feels tense but has not used it this week.  No dizziness, visual changes, cough, ST, fevers.  Hx prior episodic severe headaches, from chart review.  Upper abdominal pain/bloating:  Abdominal bloating improved but not resolved with Protonix.  H.Pylori 06/2020: negative.    Review of Systems     No data recorded    Physical Exam    On this date 05/12/2021 I have spent 13 minutes reviewing previous notes, test results and face to face (virtual) with the patient discussing the diagnosis and importance of compliance with the treatment plan as well as documenting on the day of the visit.  Katherine Patterson, was evaluated through a  synchronous (real-time) audio-video encounter. The patient (or guardian if applicable) is aware that this is a billable service, which includes applicable co-pays. This Virtual Visit was conducted with patient's (and/or legal guardian's) consent. The visit was conducted pursuant to the emergency declaration under the [employer] and the [employer], 1135 waiver authority and the [employer] and [employer] Act.  Patient identification was verified, and a caregiver was present when appropriate.  The patient was located at: Home: [address]  [place] [address]  The provider was located at: Home: [place]     Patient identification was verified at the start of the visit: YES    Services were provided through a phone synchronous discussion virtually to substitute for in-person clinic visit. Patient was located at home and provider was located in office or at home.     An electronic signature was used to authenticate this note.  -- Marita Kansas, MD

## 2021-05-12 NOTE — Progress Notes (Signed)
I called the pt for intake with Katherine Patterson interpreter. The pt verified her name and dob. During intake we got disconnected. I called her back.       Coordination of Care  1. Have you been to the ER, urgent care clinic since your last visit?  Hospitalized since your last visit? No    2. Have you seen or consulted any other health care providers outside of the [facility] since your last visit?  Include any pap smears or colon screening. No    Does the patient need refills? YES    Learning Assessment Complete? no  Depression Screening complete in the past 12 months? yes

## 2021-05-15 NOTE — Telephone Encounter (Signed)
Patient was seen virtual on 4.18.2023. She would like a work note for this date.

## 2021-05-22 NOTE — Telephone Encounter (Signed)
Patient was seen virtual on 4.18.2023. She would like a work note for this date. This is the 2nd time the patient has attempted to retreive a work note.

## 2021-05-26 ENCOUNTER — Encounter: Attending: Clinical | Primary: Family Medicine

## 2021-05-27 NOTE — Telephone Encounter (Signed)
The pt is requesting a work note for appt she had on 05/12/21, but she has a F2F  appt on 05/29/21, this Fri. The appt on the 05/12/21 was a virtual appt. The pt could be mailed the letter once the provider has written one for this date.or she could wait until Friday 05/29/21, she may need a work note also for this Friday appt as well. It is a F2F appt at the clinic and she could be handed the note for 05/12/21 and 05/29/21, in clinic Friday when she comes in. Felizardo Hoffmann, RN

## 2021-05-27 NOTE — Telephone Encounter (Signed)
The provider created a letter and I printed it and and mailed it. Salena Saner, RN

## 2021-05-29 ENCOUNTER — Encounter: Attending: Family Medicine | Primary: Family Medicine

## 2021-06-06 NOTE — Telephone Encounter (Signed)
P/C to [place]  to reschedule the behavioral health appointment from 07/07/21 because the provider is not available. The appt was rescheduled and a reminder appt letter was mailed.    Patient verbalized understanding and had no further questions.    P/C routed to [facility], LCSW    Laymond Purser

## 2021-06-12 ENCOUNTER — Ambulatory Visit: Admit: 2021-06-12 | Discharge: 2021-06-12 | Attending: Family Medicine | Primary: Family Medicine

## 2021-06-12 DIAGNOSIS — Z3A01 Less than 8 weeks gestation of pregnancy: Secondary | ICD-10-CM

## 2021-06-12 LAB — AMB POC URINE PREGNANCY TEST, VISUAL COLOR COMPARISON: HCG, Pregnancy, Urine, POC: POSITIVE

## 2021-06-12 NOTE — Consults (Signed)
Session ID: [id number]  Request ID: [id number]  Language: Spanish  Status: Fulfilled  Interpreter ID: [phone]  Interpreter Name: Wanda Plump

## 2021-06-12 NOTE — Patient Instructions (Signed)
Use TUMS para [place] del [employer]

## 2021-06-12 NOTE — Progress Notes (Signed)
Coordination of Care  1. Have you been to the ER, urgent care clinic since your last visit?  Hospitalized since your last visit? no    2. Have you seen or consulted any other health care providers outside of the [facility] since your last visit?  Include any pap smears or colon screening. no    Does the patient need refills? NO    Learning Assessment Complete? yes  Depression Screening complete in the past 12 months? yes    Chief Complaint   Patient presents with    Abdominal Pain     Follow up. Patient reports pain and bloating, patient endorses nausea. Had diarrhea x 4 days (Sunday- Wednesday) as well as  mild headaches. Had a positive pregnancy test at home.   Stopped taking medications x 1 week due to pregnancy.     BP 103/60 (Site: Right Upper Arm, Position: Sitting, Cuff Size: Medium Adult)   Pulse 77   Temp 98.6 F (37 C) (Temporal)   Ht 4' 11.84" (1.52 m)   Wt 133 lb (60.3 kg)   LMP 04/30/2021 (Approximate)   SpO2 100%   BMI 26.11 kg/m     Results for orders placed or performed in visit on 06/12/21   AMB POC URINE PREGNANCY TEST, VISUAL COLOR COMPARISON   Result Value Ref Range    Valid Internal Control, POC yes     HCG, Pregnancy, Urine, POC Positive Negative

## 2021-06-12 NOTE — Consults (Signed)
Session ID: [id number]  Request ID: [id number]  Language: Spanish  Status: Fulfilled  Interpreter ID: [phone]  Interpreter Name: Wanda Plump

## 2021-06-12 NOTE — Progress Notes (Signed)
Katherine Patterson (DOB: 1990/09/28) is a 31 y.o. female, Established patient patient, here for evaluation of the following chief complaint(s):  Abdominal Pain (Follow up. Patient reports pain and bloating, patient endorses nausea. Had diarrhea x 4 days (Sunday- Wednesday) as well as  mild headaches. Had a positive pregnancy test at home. /Stopped taking medications x 1 week due to pregnancy.)       ASSESSMENT/PLAN:  1. Less than [redacted] weeks gestation of pregnancy  Will refer to Ob.  Discussed limiting medication and stopping prior tx due to pregnancy.  May use TUMS PRN for GERD.  2. Episodic tension-type headache, not intractable  Use Tylenol and relaxation technics.  3. Gastro-esophageal reflux disease without esophagitis  Use TUMS prn.  4. Encounter for screening  -     AMB POC URINE PREGNANCY TEST, VISUAL COLOR COMPARISON    No follow-ups on file.    SUBJECTIVE:  Headache:  Headaches resolved with Celexa and Flexeril QHS.  She stopped both x 1 week after having positive pregnancy test.  Is only taking Tylenol PRN.  No dizziness, visual changes, cough, ST, fevers.  Hx prior episodic severe headaches, from chart review.  Upper abdominal pain/bloating:  Sx improved with Protonix.  She stopped 1 week ago due to positive pregnancy test.  Since pregnancy test has noted some nausea and bloating but she feels this is similar to her prior pregnancies.  H.Pylori 06/2020: negative.    Pregnancy:  patient has new partner.  They were using barrier methods.  He is supportive.  Has 2 girls 32yo and 32yo.    Review of Systems   Constitutional:  Negative for chills and fever.   Genitourinary:  Negative for dysuria, pelvic pain, vaginal bleeding and vaginal discharge.     OBJECTIVE:  Blood pressure 103/60, pulse 77, temperature 98.6 F (37 C), temperature source Temporal, height 4' 11.84" (1.52 m), weight 133 lb (60.3 kg), last menstrual period 04/30/2021, SpO2 100 %.  Physical Exam  CONSTITUTIONAL:  Well developed.  No apparent  distress.  PSYCHIATRIC: Oriented to time, place, person & situation.  Appropriate mood and affect.    Results for orders placed or performed in visit on 06/12/21   AMB POC URINE PREGNANCY TEST, VISUAL COLOR COMPARISON   Result Value Ref Range    Valid Internal Control, POC yes     HCG, Pregnancy, Urine, POC Positive Negative          An electronic signature was used to authenticate this note.  -- Marita Kansas, MD

## 2021-06-12 NOTE — Progress Notes (Signed)
Spanish interpreter [employer] services:  [phone].  Grier Mitts, LPN    Patient name and DOB verified by interpreter.  Patient given AVS , reviewed AVS and medications.  Advised to stop taking any medication due to pregnancy.  Advised can take TUMS as needed for GERD symptoms.  Reviewed with patient the referral to [facility]. [facility].  Discussed the process with patient about billing.  Patient concerned "about not getting full care due to maybe not being able to pay. " Informed that care will be given and that she will receive a bill but must present the bill to the [facility] [employer] ) to do financial assistance.  Patient declined at this time to utilize the [facility] practice recommended to her.  Patient states " I will go to the place I know.  "  Informed there is the health department.  Patient was informed that if not successful at following an OB on her own to please contact our office, patient verbalized understanding.  More time given for questions and answers, patient verbalized understanding of information.  Grier Mitts, LPN    Patient given work note for today's visit.  Grier Mitts, LPN

## 2021-07-07 ENCOUNTER — Encounter: Attending: Clinical | Primary: Family Medicine

## 2021-07-16 NOTE — Telephone Encounter (Signed)
P/C to Katherine Patterson  to remind her of the follow up behavioral health appointment for 07/21/21. Mrs. Katherine Patterson canceled the appointment because she is going start a new job on Monday and can't miss work.     Mrs. Katherine Patterson will contact the office at a later day to reschedule.    P/C routed to [facility], LCSW    Katherine Patterson

## 2021-07-21 ENCOUNTER — Encounter: Attending: Clinical | Primary: Family Medicine

## 2022-01-07 NOTE — Progress Notes (Signed)
Formatting of this note might be different from the original.  Called from [facility]; patient with EDC of 01/30/22 and has 2 prior cesarean sections.  Desires repeat and tubal sterilization.  First available time for c/s is 1/4 at 10:30am; scheduled for then.  Electronically signed by Reynold Bowen, MD at 01/07/2022  2:55 PM EST

## 2022-01-28 NOTE — Care Plan (Signed)
Formatting of this note might be different from the original.  The patient is Moderately Stable - Low risk of patient condition declining or worsening    Problem: Knowledge Deficit  Goal: Patient/family/caregiver demonstrates understanding of disease process, treatment plan, medications, and discharge instructions  Outcome: Progressing    Problem: C-Section Postpartum Care  Goal: Patient vital signs are stable  Outcome: Progressing  Goal: Dressing intact until removed with any drainage marked  Outcome: Progressing  Goal: Fundus firm at midline  Outcome: Progressing  Goal: Moderate rubra without clots, no purulent discharge, no foul smelling lochia  Outcome: Progressing  Goal: Urine output is 30 mL/hour or more  Outcome: Progressing  Goal: Patient is able to void/empty bladder after catheter is removed  Outcome: Progressing    Problem: Pain  Goal: Resident's pain/discomfort is manageable  Outcome: Progressing    Problem: Infection  Goal: Signs and symptoms of infections are decreased or avoided  Outcome: Progressing        Electronically signed by Roselie Awkward, RN at 01/28/2022  6:17 PM EST

## 2022-01-28 NOTE — Nursing Note (Signed)
Formatting of this note might be different from the original.  F-DAR    Focus: Admission to M8E    Data: 32 y.o G3P3 who delivered a BB at 39.[redacted] wks GA on 01/04 @ 1205 via repeat C section with BTL. QBL 393 Hgb 11.9 repeat CBC due at 01/05 @ 0300, O+ blood type, NKA, PNC at [facility]. Foley positioned below level of bladder and draining appropriately, D5NACL infusing at 125 ml/hr. Scant bleeding, fundus midline at U, pain well controlled Breastfeeding infant      Action: Completed vitals, assessment, reviewed admission packet. Educated on pain management, plan of care, lochia changes, peri care via Marrti     Response: VSS, no assessment concerns. Pt verbalized understanding of teaching     Plan: Continue with plan of care       Electronically signed by Roselie Awkward, RN at 01/28/2022  6:23 PM EST

## 2022-01-28 NOTE — H&P (Signed)
Formatting of this note is different from the original.  Chief Complaint: Scheduled c-section    Assessment   Katherine Patterson is a 32 y.o. G1P0 gravid, [redacted]w[redacted]d presenting for her scheduled cesarean delivery for history of prior cesarean delivery.    Plan   Repeat CS and BTL/BS  - Patient consented for RCS, BTL/BS  - Labs pending  - Reactive NST prior to surgery  - Confirmed pts NPO status    Will proceed to OR for scheduled procedure.    PNL: O+    Postpartum Planning:   Baby: Boy, no circ   Feeding: Breast   Planned birth control: BTL/BS, medicaid papers signed and mature    Consent:  Options discussed with patient and consent obtained to proceed with admission for cesarean delivery. We have reviewed the risks and potential complications of surgery including infection, hemorrhage, injury and the possible need for future surgeries. She has been consented for blood products as need. All questions were answered.     Discussed with team.    Subjective   History of Present Pregnancy  Katherine Patterson is a 32 y.o. G1P0 at [redacted]w[redacted]d gestation who is scheduled for CESAREAN SECTION on 01/28/2022. Patient reports good fetal movement, no complaints.    Past Medical History:  Denies  Surgical History:  CSx2  Social History:  Denies x3    Family History:  Non-contributory    Obstetric History:  OB History   Gravida Para Term Preterm AB Living   1             SAB IAB Ectopic Multiple Live Births       # Outcome Date GA Lbr Len/2nd Weight Sex Delivery Anes PTL Lv   1 Current              Gynecologic History:  Hx of CT    Objective   Allergies:   Patient has no allergy information on record.    Medications:  PNV    Review of Systems   Constitutional: Negative for fever.   HENT: Negative for congestion, rhinorrhea, sore throat and trouble swallowing.    Eyes: Negative for visual disturbance.   Respiratory: Negative for cough, shortness of breath and wheezing.    Cardiovascular: Negative for chest pain and leg swelling.    Gastrointestinal: Negative for abdominal pain, constipation, diarrhea, nausea and vomiting.   Genitourinary: Negative for dysuria, frequency, hematuria, urgency and vaginal bleeding.   Musculoskeletal: Negative for arthralgias, joint swelling and myalgias.   Skin: Negative for rash and wound.   Allergic/Immunologic: Negative for immunocompromised state.   Neurological: Negative for syncope, weakness, numbness and headaches.   Psychiatric/Behavioral: Negative for dysphoric mood. The patient is not nervous/anxious.         Mood appropriate    All other systems reviewed and are negative.    Last Measurements:       There is no height or weight on file to calculate BMI.  Last 24h Vitals:  No data recorded Last:    BP  Min: 101/64  Max: 101/64 Last: 101/64  Pulse  Avg: 102  Min: 102  Max: 102 Last: (!) 102  No data recorded Last:    SpO2  Avg: 100 %  Min: 100 %  Max: 100 % Last: 100 %  Pain  Last:      Physical Exam  Constitutional:       General: She is not in acute distress.     Appearance: Normal appearance.  HENT:      Head: Normocephalic and atraumatic.   Cardiovascular:      Rate and Rhythm: Normal rate.   Pulmonary:      Effort: Pulmonary effort is normal. No respiratory distress.   Musculoskeletal:         General: Normal range of motion.   Skin:     General: Skin is warm and dry.   Neurological:      General: No focal deficit present.      Mental Status: She is alert and oriented to person, place, and time.   Psychiatric:         Attention and Perception: Attention normal.         Mood and Affect: Mood normal.         Behavior: Behavior normal.         Judgment: Judgment normal.     Relevant Results:  I have reviewed the patient's labs and comment on significant findings in the A/P:   CBC:   Lab Results   Component Value Date    WBC 7.1 01/28/2022    HGB 11.9 (L) 01/28/2022    HCT 33.8 (L) 01/28/2022    MCV 86.0 01/28/2022    PLT 212 01/28/2022     Type & screen: O Positive  Rh: No results found for  requested labs within last 365 days.  Rubella IGG: No results found for requested labs within last 365 days.  Varicella IGG: No results found for requested labs within last 365 days.  HIV: No results found for requested labs within last 365 days.  Syphilis: No results found for requested labs within last 365 days.  Heb B: No results found for requested labs within last 365 days.  Hep C: No results found for requested labs within last 365 days.  GBS: No results found for requested labs within last 365 days.  50g GTT: No results found for requested labs within last 365 days.    I have reviewed the following imaging reports and comment on significant findings in the A/P: No relevant imaging to review      Electronically signed by Candy Sledge, MD at 01/29/2022 11:37 AM EST    Associated attestation - Candy Sledge, MD - 01/29/2022 11:37 AM EST  Formatting of this note might be different from the original.  Teaching Provider Attestation: I personally saw and evaluated the patient. I repeated the critical/key portions of the service and discussed with the resident.  I have reviewed the resident's documentation and edited as appropriate.   Date of service if different than date of resident service:

## 2022-01-28 NOTE — Unmapped (Signed)
Formatting of this note is different from the original.  Operative Note    Date: 01/28/2022  Location: [facility] MAIN OB INTRAOP    Name: Katherine Patterson, DOB: 1990/07/18, MRN: [id number]    Diagnosis  Pre-op Diagnosis  32 yo G3P2002  Hx of CSx2 Post-op Diagnosis  32 yo [id number] s/p uncomplicated RCS and BS     Procedures    * CESAREAN SECTION   Uterine incision type:  low transverse  Bilateral Salpingectomy    Surgeons      * Ob Generic Physician - Primary     * Candy Sledge  Staff:   Circulator: Llana Aliment, RN; Marianna Fuss, RN  Scrub Person: Orlinda Blalock  Was attending physician present at delivery? Yes     Procedure Summary  Anesthesia: Spinal  ASA: II  No data recorded  Total IV Fluids: See Intake/Output data below.  Drains: * None in log *   Kinslea, Frances [[id number]]  Female - 32 y.o. - Jul 05, 1990  CESAREAN SECTION (Abdomen)         Intraprocedure I/O Totals       None          Estimated/Quantitative Blood Loss: 295  Specimens: None  Meds: 2g Ancef pre-operatively, 30U dilute pitocin after placental.    Findings: Normal maternal anatomy (uterus, tubes, and ovaries). Dense adhesive disease of the rectus fascia. Liveborn neonate weighing 3460 g  delivered 01/28/2022 12:05 PM . Apgars were 9  at 1 minute and 9  at 5 minutes.   Lab Results   Component Value Date    PHCART 7.29 (L) 01/28/2022    PCO2CART 40 01/28/2022    HCO3CART 20 (L) 01/28/2022      Review the Delivery Report for details.     Complications: None; patient tolerated the procedure well.    Indications: Katherine Patterson is an 32 y.o. patient G3P2002 at [redacted]w[redacted]d undergoing C-section for History of c-section x2. Patient has undesired fertility.    Consent: Written informed consent obtained.      Procedure Details:  Patient was taken to the operating room where the combined spinal/epidural anesthesia was placed and found to be adequate. Following epidural dosing the fetal heart tones decelerated and did not quickly recover so the  decision was made to convert to an emergent c-section. The patient prepped with betadine and draped in a sterile fashion. Prior to the incision, the level of anesthesia was rechecked and found to be adequate. A time out was performed.     A Pfannenstiel incision was made 2cm above the pubic symphysis. This incision was carried down sharply to the level of the rectus fascia. The fascia was incised sharply with the knife, and then the incision was extended bilaterally and superiorly by pulling and with bovie cautery. The rectus muscles were then separated in the midline and the peritoneum was identified. The peritoneum was identified and entered bluntly with surgeons' fingers. The peritoneal opening was then extended manually by pulling and with bovie cautery. The operator's hand was then inserted into the abdomen and the uterus was found to be in a midline position. The bladder blade was then inserted.    A low transverse incision was made in the uterus using the knife and dissected bluntly using the operator's finger. The incision was extended laterally and superiorly bilaterally bluntly. The operator's hand was then inserted into the uterus to find an infant in the vertex position. The bladder blade was removed and the infant's vertex  was grasped, flexed, and brought to the incision where the infant was delivered atraumatically using fundal pressure. The cord was doubly clamped and cut. The infant was then passed to waiting team. The placenta was then delivered and a cord gas was collected by the nursing staff and sent at that time.     The uterus was then exteriorized and the uterine cavity was wiped of all clots and debris. The bladder blade was reinserted, and the hysterotomy incision was repaired first with a #0 Vicryl suture in a running locked fashion, followed by a second #0 Vicryl suture in an imbricating fashion. Tubes, ovaries, and adnexa were noted to be normal.    Attention was then turned to the  salpingecomty. The right fallopian tube was identified and followed out to the fimbria. It was identified and grasped using the Babcock clamp. It was then removed with the Ligasure device to the cornua. The tube was sent to pathology for examination and confirmation. Attention was then turned to the left tube and the above procedure was repeated on that side. Hemostasis was noted to be excellent. The uterus was then gently replaced into the abdomen.    The rectus fascia was closed using one #1 Vicryl sutures in a running fashion. The incision was then irrigated and hemostasis was achieved using the Bovie. An interrupted subcuticular closure was performed with 2-0 Vicryl. The skin was closed using 4-0 Monocryl in a subcuticular fashion and bandaged with steri-strips. All counts were correct times two. No RFA signals were detected after scanning. The patient tolerated the procedure well and was taken to a labor room in stable condition. Dr. Candy Sledge was present throughout all key portions of the procedure.     Disposition: Delivering patient to L&D room. Baby admitted to nursery, disposition to L&D room with delivering patient.  Condition: Delivering patient stable.      Electronically signed by Candy Sledge, MD at 01/29/2022 11:39 AM EST    Associated attestation - Candy Sledge, MD - 01/29/2022 11:39 AM EST  Formatting of this note might be different from the original.  I was present during all critical and key portions of the procedure(s) and immediately available to furnish services the entire duration.  See resident note for details.

## 2022-01-29 NOTE — Progress Notes (Signed)
Formatting of this note is different from the original.         Social Work Initial Assessment    Katherine Patterson is a 32 y.o. who delivered a BB "Katherine Patterson" on 01/28/2021 via CS. SW notes consult for transportation insecurity and no crib.  SW met with mother at bedside, educated to SW role and completed initial assessment.  Also at bedside was Katherine Patterson #[id number].  As part of the assessment, this SW has reviewed and updated the patient's phone number and address in Epic.     Patient Demographics:   Address:   [place] [id number]  50 name: BB "Katherine Patterson"  Mother of baby: 76 y/o Katherine Patterson ([id number]  Father of baby (FOB) is - Mom declined to provide name/contact  FOB is not involved and will not sign baby's birth certificate.    Mother lives at the above address with: Children and "Man that rents the property."   Mother has 2 other children.  Ages: 75,4    Advance Care Planning:        Mother's Legal Next of Kin is: Mom's sister, Christinia Edgar Corrigan (p) [phone]        Mother does not not have an advance care plan.         70 Legal Next of Kin is: Mother, [name] ([phone]    Insurance: [facility]        Mother has been educated on how to add baby to her insurance plan.      Feeding Plan:       Mother plans to breast feed baby       Mother does not have a breast pump.      Based on my assessment, the patient's identified strengths and challenges, including barriers to discharge are:     Identified Strengths:  ? Mother received her prenatal care at: [facility]   ? Mother's Support System: family  ? Current Water quality scientist: Department of Social Services (DSS); Medicaid/SNAP and [place] [employer]   ? 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 Mother has the following baby supplies: basinet and diapers  ? Mother denied Current Substance Misuse, History of Substance Misuse,  Interpersonal Violence (IPV) and Mental Health History    Identified Challenges:  ? Mother with Transportation to the baby's follow up    o Mom confirmed need; SW to arrange transportation via [employer]. SW educated and informed mom that transportation information will be provided in the AVS. Mom verbalized understanding using Katherine Patterson and denied any questions/concerns.     ? Mother does not have the following baby supplies: carseat and breast pump   o Mom endorsed that she is enrolled in a program at [employer] which is where mom's 63 y/o daughter attends school. Mom stated the school is to provide a car seat, wipes/diapers Monday. Mom stated she would borrow her niece's car seat until she obtains the car seat from her daughter's school. Mom stated she has a safe sleep option at home and could not indentify name, but showed this Probation officer a picture; SW confirmed appropriate safe sleep option; Picture provided of a Co sleeper Crib.  o Mom stated she was currently enrolled in WIC/SNAP/Medicaid, knows how to add the baby to all, and has DSS Case Worker name/contact. SW provided [facility] and Medicaid contact number. Mom denied any other concerns/needs at this time.  TRANSPORTATION  Transportation   Transportation for the baby's follow up:   Scheduled on 02/01/2021  @ 12:15 pm  TRANSPORTATION PLAN:    Confirmed   [employer] Phone:   [phone]    Transportation Pre-Authorization/Ref #:  [id number]  Concerns for Discharge   Concerns for Discharge:   Transportation for the baby's follow up   Social Work Discharge Interventions:     SW paged to assist with transportation need. Patient confirmed need; SW verified pick up address of [place]. SW called and spoke with [facility] Worker Shanon Brow (p) [phone] at 15:44 to arrange trip. Patient ride scheduled for 02/01/2021 with a pick up time at 11:30 AM and WILL Call ride home; Pt aware and informed that the  information will be added to AVS and provided at discharge.    SW educated and provided resources listed:  1. WIC/Medicaid contact   2. [place]   3. Safe Sleep/Fall Prevention   4. Arrange transportation for the baby's follow up appointment     SW updated:  The Team     The patient is low risk for readmission based on my assessment and the recorded risk score of 6%. Based on this risk, the current plan is to discharge the patient to home. The patient is willing to use wrap around services at discharge to include [place].       Transitions of care plan includes:  ? Expected date of discharge: 02/01/2021  ? Patient's preference and goals for treatment and/or discharge are: Successful discharge home, Stabilization   ? Mother will continue to be followed by [facility] for her partpartum care.    ? 65 pediatrician will be [facility]   ? SW provided education to mother on Perinatal Mood and Anxiety Disorders (PMAD's).    ? Plan for addressing identified challenges: SW provided community resources   ? Preferred pharmacy: [facility], [place] [address] [phone]  ? Transportation for discharge: Family/Friend to pick-up (Mom's sister ).  Family has carseat for discharge transportation.    ? Transportation needs for ongoing medical care: Cab, Medicaid Cab and Family Vehicle        SW will continue to follow for support and discharge planning needs.     Bobbye Riggs, MSW  Care Coordination Department  Pager : [phone]        Electronically signed by Bobbye Riggs at 01/29/2022  3:51 PM EST

## 2022-01-29 NOTE — Lactation Note (Signed)
Formatting of this note is different from the original.  This note was copied from a baby's chart.  [place]  Reason for Consult: Initial assessment; Difficult latch  Consultant Name: Sabino Dick, [place]  Has mother breastfed before?: Yes  How long did the mother previously breastfeed?: 6 months  Latch: 1  Audible Swallowing: 1  Type of Nipple: 2  Comfort (Breast/Nipple): 2  Hold (Positioning): 1  LATCH Score: 7  Lactation Tools: Nipple shields    Assessment  Breast assessment performed: with consent  Breastfeeding education including feeding cues, feed on demand, breast massage, positioning, breast supports, deep latch, manual expression, skin to skin, spoon feeding and nipple shield.  Assisted mother with latch and positioning.  Observed feeding: yes using football hold    Breast exam findings:    Breast size and shape: small, round    Breast consistency: dense    Nipple appearance: intact    Nipple size and shape: dimpled, short shafted  Colostrum noted, Successful latch and feeding observed with nipple shield, no milk seen in shield, but able to hand express colostrum. Discussed impact of formula on milk supply.  Mother verbalized understanding of education received.    Plan  Mother's current feeding plan: breastfeed and formula  Feeding Type: Formula  Feeding Route: Bottle  Feed using cues 8-12 times per day. Skin to skin. Massage breast before each feeding.  Pumping plan:   Feeding devices: bottle, nipple shield  Encourage mom to call [place] or RN to observe next feeding.    Interpreter Services if applicable: Rudell Cobb [phone]    Mothers primary language: Spanish [96]   Gestational age: Gestational Age: [redacted]w[redacted]d  Type of delivery: C-Section, Low Transverse  Estimated Blood Loss:   Delivery Blood Loss  01/28/22 1202 - 01/29/22 1205    Calculated Blood Loss (mL) Hospital Encounter 98 mL    Surgical Calculated Blood Loss (mL) Anesthesia 295 mL    Total  393 mL       Maternal risk factors:    Maternal Medications: Current Facility-Administered Medications: ?  acetaminophen (Tylenol) tablet 650 mg, 650 mg, Oral, Once PRN, [place] Abbot, NP?  acetaminophen (Tylenol) tablet 975 mg, 975 mg, Oral, q6h, Standley Dakins, MD, 975 mg at 01/29/22 0429?  bisacodyl (Dulcolax) suppository 10 mg, 10 mg, Rectal, Daily PRN, Standley Dakins, MD?  dextrose 5% and sodium chloride 0.9% infusion, 125 mL/hr, Intravenous, Continuous, Standley Dakins, MD, Last Rate: 125 mL/hr at 01/28/22 1545, 125 mL/hr at 01/28/22 1545?  diphenhydrAMINE (BENADryl) capsule 25 mg, 25 mg, Oral, q6h PRN, Standley Dakins, MD?  docusate sodium (Colace) capsule 100 mg, 100 mg, Oral, BID PRN, Standley Dakins, MD, 100 mg at 01/29/22 8413?  HYDROmorphone (Dilaudid) injection 0.5 mg, 0.5 mg, Intravenous, Once PRN, Standley Dakins, MD?  ibuprofen tablet 600 mg, 600 mg, Oral, q6h, Standley Dakins, MD, 600 mg at 01/29/22 2440?  ketoROLAC (Toradol) injection 30 mg, 30 mg, Intravenous, q6h PRN, Standley Dakins, MD?  lanolin (Lansinoh) cream, , Topical, PRN, Standley Dakins, MD?  magnesium hydroxide (Milk of Magnesia) suspension 30 mL, 30 mL, Oral, Nightly PRN, Standley Dakins, MD?  Derrill Memo ON 02/21/2022] naLOXone (Narcan) injection 0.04 mg, 0.04 mg, Intravenous, PRN, Standley Dakins, MD?  ondansetron (Zofran) tablet 4 mg, 4 mg, Oral, q8h PRN **OR** ondansetron (Zofran) injection 4 mg, 4 mg, Intravenous, q8h PRN, Standley Dakins, MD?  oxyCODONE (Roxicodone) immediate release tablet 5 mg, 5 mg, Oral, q6h PRN, Suzan Slick, MD, 5 mg at 01/28/22 1827?  oxytocin (Pitocin)  infusion in sodium chloride 0.9% 30 units/500 mL, 500 mL, Intravenous, Once PRN, [place] Abbot, NP?  oxytocin (Pitocin) infusion in sodium chloride 0.9% 30 units/500 mL, 500 mL, Intravenous, Once PRN, [place] Abbot, NP?  pramoxine-hydrocortisone foam, , Topical, PRN, Standley Dakins, MD?  prochlorperazine (Compazine) 5 mg/mL injection 5 mg, 5 mg, Intravenous, q6h  PRN, Standley Dakins, MD?  senna (Senokot) tablet 17.2 mg, 2 tablet, Oral, Nightly PRN, Standley Dakins, MD?  simethicone (Mylicon) chewable tablet 40 mg, 40 mg, Oral, 4x daily PRN, Standley Dakins, MD?  Vascular access, , , Once **AND** Saline lock IV, , , Once **AND** sodium chloride flush 0.9 % 1-10 mL, 1-10 mL, Intravenous, PRN, [place] Abbot, NP?  Insert peripheral IV, , , Once **AND** Saline lock IV, , , Once **AND** sodium chloride flush 0.9 % 1-10 mL, 1-10 mL, Intravenous, PRN, [place] Abbot, NP   Contraception plan: No data found   Pain control: Spinal epidural  Birth weight: 3460 g  Current weight: 3460 g (Filed from Delivery Summary)  Birth weight change: 0%    Infant risk factors:   Prior breastfeeding experience:  Has mother breastfed before?: Yes  How long did the mother previously breastfeed?: 6 months  Times breastfed in past 24 hours: x 6 at 18 hours  Supplemental milk type:Formula 10 mls  Pump availability and type:        Electronically signed by Sabino Dick, RN at 01/29/2022 12:27 PM EST

## 2022-01-29 NOTE — Care Plan (Signed)
Formatting of this note is different from the original.    Problem: Discharge Planning - Care Management  Goal: Discharge to post-acute care or home with appropriate resources  Flowsheets (Taken 01/29/2022 1323)  Discharge to post-acute care or home with appropriate resources:   Conduct assessment to determine patient/family and health care team treatment goals, and need for post-acute services based on payer coverage, community resources, and patient preferences, and barriers to discharge   Address psychosocial, clinical, and financial barriers to discharge as identified in assessment in conjunction with the patient/family and health care team   Arrange appropriate level of post-acute services according to patient?s   needs and preference and payer coverage in collaboration with the physician and health care team   Communicate with and update the patient/family, physician, and health care team regarding progress on the discharge plan   Arrange appropriate transportation to post-acute venues    Electronically signed by Bobbye Riggs at 01/29/2022  1:24 PM EST

## 2022-01-29 NOTE — Unmapped (Signed)
Formatting of this note is different from the original.  OB Anesthesia Progress Note    Assessment/Plan   Assessment: No apparent postop OB Anesthesia concerns.    Plan: OB Anesthesia will sign-off. Please reconsult if concerns.      Subjective   Nausea/Vomiting in Last 24 Hours: No  Pruritis: No  Numbness or Tingling in Lower Extremity: No  Back pain: Backpain: Absent  Headache Currently: no  Ambulated After Delivery: Ambulated After Delivery: yes       Objective   Pain Score: 5 - Moderate pain    Visit Vitals  BP 100/64   Pulse 82   Temp 36.7 C (98.1 F) (Oral)   Resp 18   Ht 1.524 m   Wt 68.2 kg   LMP 04/25/2021   SpO2 97%   Breastfeeding Yes Comment: breast and formula   BMI 29.36 kg/m   OB Status Pregnant   BSA 1.7 m     No results found for: "PTT", "INR", "FIBRINOGEN"    Catheter Site: Clean, Dry, and Intact    Katherine Patterson, [place] 01/29/2022 7:20 AM  Electronically signed by Lennie Odor, MD at 01/29/2022  9:45 AM EST

## 2022-01-30 NOTE — Care Plan (Signed)
Formatting of this note might be different from the original.  The patient is Recovery of a Repeat C-Section with BTL    The patient's goals for the shift include VSS, assessment, breastfeeding on demand, and voiding.     Over the shift, the patient made progress toward the following goals:  VSS, assessment breastfeeding, voiding, and discharge instructions    Barriers to progression:  None    Recommendations to address these barriers include: Continue plan of care      Electronically signed by Sunny Schlein, RN at 01/30/2022 10:07 AM EST

## 2022-01-30 NOTE — Unmapped (Signed)
Formatting of this note might be different from the original.  Images from the original note were not included.      Cuidados postparto despus de una cesrea - Video   Despus de dar a luz, su cuerpo necesita tiempo para sanar. A esto lo llamamos el perodo "postparto". Generalmente dura unas seis semanas. Es importante que siga su plan de cuidados durante este perodo para que pueda recuperarse completamente.   Para ver el video ir a esta direccion web:   https://bit.ly/[email or web address]   O, escanear este codigo QR con el telefono inteligente      2020 [employer].       Electronically signed by Sunny Schlein, RN at 01/30/2022 10:15 AM EST

## 2022-01-30 NOTE — Unmapped (Signed)
Formatting of this note might be different from the original.  Images from the original note were not included.      [id number]   Informacin sobre la tristeza posparto   Tener un beb es un momento emocionante que causa felicidad en la vida de la [place]. Pero tambin puede ser agotador y Programme researcher, broadcasting/film/video. Muchas mujeres que acaban de tener un beb se sorprenden cuando descubren que se sienten tristes o malhumoradas al ajustarse a la vida con un recin nacido. Pero estos sentimientos son muy comunes. En la [employer], solo dura 1 o 2 semanas despus del [place]. Se llama tristeza posparto.   Cules son los sntomas de la tristeza posparto?   Usted acaba de tener un beb y se siente Occupational psychologist. Pero, al [employer], quizs tambin se sienta [place], [place], [place] y de mal humor. Es probable que [employer]. Si tiene estos y otros sntomas leves de depresin, es probable que tenga tristeza posparto. Pero no est sola. Aproximadamente 4 de cada 5 mams primerizas se sienten as despus del parto.   La tristeza posparto es una afeccin que aparece y desaparece rpidamente. Por lo general, los sntomas ocurren 1 o 2 das despus del parto. Podra sentirlos con [employer]. Pero luego desaparecen por s solos en unas 2 semanas.   Si se sigue sintiendo as despus de ms de 2 semanas o si en algn momento empieza a sentirse peor, llame a su proveedor de atencin mdica. Podra tener un trastorno del estado de nimo ms grave que se llama depresin posparto. Este trastorno se trata con medicamentos y terapia conversacional.     Cules son las causas [place] posparto?   Los expertos no conocen la causa exacta de la tristeza posparto. Probablemente tenga que ver con los [employer] niveles de las hormonas que se producen durante el embarazo y [place]. Durante ese tiempo, los niveles de estrgenos y progesterona disminuyen considerablemente. Esto podra desencadenar sentimientos de  depresin.   Pero tambin podran contribuir otros factores. Tener un beb es un cambio importante. Puede ser un perodo muy agobiante. Cuidar a un nuevo beb puede ser desgastante tanto fsica como emocionalmente. Podra estar cansada por la falta de sueo. Quizs se sienta abrumada o sola, o tenga problemas para acomodar su rutina con el beb. Todo esto puede afectar a la manera de sobrellevar este perodo ajetreado y cargado de emociones.     Sntomas de la tristeza [employer] sntomas se incluyen los siguientes:     Tristeza     Estar irritable     Llanto     Cambios de estado de nimo     Dificultad para dormir     Falta de Teacher, early years/pre (agotamiento fsico)     Falta de concentracin     Ansiedad     Diagnstico de la tristeza posparto   No existe un diagnstico estndar para esta afeccin. Si tiene 3 o 4 de los sntomas mencionados despus de tener un beb, es probable que tenga tristeza posparto.   Llame al proveedor de atencin mdica de inmediato si los sntomas duran ms de 2 semanas o si empiezan a empeorar. El proveedor Immunologist y har lo posible para corroborar que no tenga depresin posparto.     Manejo de la tristeza posparto   La tristeza posparto suele desaparecer sin necesidad de tratamiento. Lo mejor que puede hacer para [employer] sntomas es cuidarse. Para  obtener la ayuda y el apoyo que necesita, es importante que haga lo siguiente:     Exprese sus sentimientos. Hable con su pareja u otros allegados sobre cmo se est sintiendo. No reprima sus sentimientos.     Duerma lo suficiente. Quizs no pueda dormir toda la noche por ahora. Pero descanse cada vez que el beb est durmiendo. No use ese tiempo para hacer las tareas del hogar.     Coma alimentos saludables. Seguir una dieta saludable puede ayudarla a sentirse mejor y a Contractor equilibrio en el estado de nimo.     Pida ayuda y acptela. Deje que los amigos y familiares ayuden con las comidas, las compras, el lavado  de ropa o la supervisin de sus otros hijos.     Conctese con otras mams primerizas. Busque en lnea grupos para mams primerizas en su zona. O pida sugerencias a su proveedor de atencin mdica.     Es tristeza posparto o depresin posparto?   Es normal que sienta ganas de Research officer, trade union y se sienta abrumada o malhumorada despus de Best boy un beb. Pero si los sntomas duran ms de 2 semanas o empeoran, llame a su proveedor de Geophysical data processor. El proveedor la evaluar para ver si tiene una afeccin ms grave llamada depresin posparto. En ese caso, es importante recibir tratamiento de inmediato.     Last Reviewed Date: 05/26/2018    2000-2023 [employer] [place]. All rights reserved. This information is not intended as a substitute for professional medical care. Always follow your healthcare professional's instructions.       Electronically signed by Sunny Schlein, RN at 01/30/2022 10:15 AM EST

## 2022-01-30 NOTE — Unmapped (Signed)
Formatting of this note is different from the original.  Images from the original note were not included.      [place] es un tipo de [place]. Es el nacimiento de un beb a travs de una incisin realizada en el abdomen de la madre. Larey Brick se puede planificar y Risk manager. Pero en [employer], es inesperada. En cualquier caso, se hace para garantizar el parto ms seguro tanto para usted como para su beb.    Antes del procedimiento   Le darn antibiticos. Es para reducir Catering manager de infeccin. La mayora de las cesreas llevan menos de 1 hora. Su equipo de atencin [employer] listo para cuidar de usted y de su beb recin nacido. Su pareja puede acompaarla durante el nacimiento.     Durante el procedimiento   La ciruga comenzar despus de que reciba anestesia. La anestesia ser regional o general. La anestesia regional adormece el cuerpo de la cintura para abajo. La anestesia general har que se duerma. Se realizan incisiones en la piel y en el tero. Estas 2 incisiones pueden ser diferentes. Asegrese de que se registren las incisiones en su historia clnica. Pueden variar como se explica a continuacin:     La incisin en la piel. Por lo general, este corte se hace de lado a lado. Esto se conoce como incisin transversal. Se realiza sobre la lnea del vello pbico. Podran realizarle una incisin de arriba a abajo (vertical) si le han hecho este tipo de incisin antes. O bien, si es necesario hacer la cesrea rpido.     La incisin en el tero. Por lo general, se hace una incisin transversal. Este tipo de herida cicatriza muy bien. Esto puede llegar a permitirle un futuro parto vaginal. En algunos casos, podra ser necesario realizar una incisin vertical.    Una vez que realizan las incisiones, el proveedor de atencin mdica ejerce presin en la parte superior del tero. El profesional gua al beb para salir por la abertura. Luego se pinza y se corta el cordn umbilical. Por  ltimo, se extrae la placenta a travs de la incisin.     Sitios posibles para realizar una incisin transversal o una incisin vertical en la piel.      Despus del procedimiento   Despus del nacimiento del beb, la incisin del tero se cierra con puntos. Le cerrarn la incisin de la piel con grapas quirrgicas o puntos. Le aplicarn un apsito. Su proveedor de Psychologist, prison and probation services presin sobre el abdomen. Esto ayuda a que los cogulos de sangre salgan por la vagina. Es posible que le den [employer]. Estos ayudan a Archivist. Tambin pueden reducir [employer].     El cuidado del beb despus de la cesrea   Colocarn al beb en una incubadora. All le aspirarn con suavidad el exceso de lquido de la boca y de las vas respiratorias. A continuacin, se evaluar el estado fsico del beb segn el sistema de puntuacin APGAR. Esta prueba evala los siguientes aspectos de su beb:     Aspecto (color de piel que indique un flujo sanguneo saludable)     Pulso (frecuencia cardaca)     Muecas (reflejos musculares)     Actividad (tono muscular)     Respiracin   Le traern al beb envuelto en Josefine Class. En ese momento, por primera vez, podr ver a su recin nacido.     [place]  con cualquier ciruga, la cesrea conlleva algunos riesgos. Su proveedor de Air traffic controller de [employer]. Entre ellos, se encuentran los siguientes:     Sangrado     Infeccin     Lesin en los rganos cercanos     Cogulos de sangre en las piernas, la pelvis o los pulmones     Reaccin a la anestesia     Last Reviewed Date: 07/26/2018    2000-2023 [employer] [place]. Todos los derechos reservados. Esta informacin no pretende sustituir la atencin Glass blower/designer. Slo su mdico puede diagnosticar y tratar un problema de salud.       Electronically signed by Sunny Schlein, RN at 01/30/2022 10:14 AM EST

## 2022-01-30 NOTE — Unmapped (Signed)
Formatting of this note might be different from the original.  31yo G3 now P3003 presented for RCS+BS. Underwent uncomplicated surgery. Developed URI in postpartum and tested positive for Flu + Covid + Coronavirus. Started on Tamiflu. Discharged POD2.  Electronically signed by Rayna Sexton, MD at 01/30/2022  6:25 AM EST

## 2022-01-30 NOTE — Lactation Note (Signed)
Formatting of this note is different from the original.  This note was copied from a baby's chart.  [place]  Reason for Consult: Follow-up assessment; Difficult latch  Consultant Name: Sabino Dick, [place]  Has mother breastfed before?: Yes  How long did the mother previously breastfeed?: 6 months  Latch: 0  Audible Swallowing: 0  Type of Nipple: 2  Comfort (Breast/Nipple): 0  Hold (Positioning): 0  LATCH Score: 2  Lactation Tools: Nipple shields    Assessment  Breast assessment performed: with consent  Reinforced breastfeeding education including feeding cues, feed on demand, breast massage, positioning, breast supports, deep latch, manual expression, skin to skin, spoon feeding and nipple shield.Instructed in use of manual pump, storage and warming of breast milk. Shown human milk storage guidelines in postpartum booklet.   Assisted mother with latch and positioning in the cross cradle hold.  Instructed in engorgement management. If pumping pump for comfort and not to empty. Use ice, never apply directly to the skin, and for 5-7 minutes.   Reviewed lactation resources.     Breast exam findings:    Breast size and shape: small, round    Breast consistency: dense    Nipple appearance: intact    Nipple size and shape: dimpled, short shafted  Colostrum noted, infant did not latch, sleepy at the breast. Mother able to pump 20 mls of colostrum.   Mother verbalized understanding of education received.    Plan  Mother's current feeding plan: breastfeed and formula  Feeding Type: Formula  Feeding Route: Bottle  Feed using cues 8-12 times per day. Skin to skin. Massage breast before each feeding.Offer breast before giving formula /expressed breast milk.  Pumping plan:per mother's discretion   Feeding devices: bottle, nipple shield  Encourage mom to call [place] or RN to observe next feeding.    Interpreter Services if applicable: Westley Hummer [phone]    Mothers primary language: Spanish [96]   Gestational age:  Gestational Age: [redacted]w[redacted]d  Type of delivery: C-Section, Low Transverse  Estimated Blood Loss:   Delivery Blood Loss  01/28/22 1202 - 01/29/22 1205    Calculated Blood Loss (mL) Hospital Encounter 98 mL    Surgical Calculated Blood Loss (mL) Anesthesia 295 mL    Total  393 mL       Maternal risk factors:   Maternal Medications: Current Facility-Administered Medications: ?  acetaminophen (Tylenol) tablet 650 mg, 650 mg, Oral, Once PRN, [place] Abbot, NP?  acetaminophen (Tylenol) tablet 975 mg, 975 mg, Oral, q6h, Standley Dakins, MD, 975 mg at 01/30/22 0630?  bisacodyl (Dulcolax) suppository 10 mg, 10 mg, Rectal, Daily PRN, Standley Dakins, MD?  dextromethorphan-guaiFENesin (Robitussin-DM) liquid 10 mL, 10 mL, Oral, q4h PRN, [place] Grant, NP, 10 mL at 01/30/22 1115?  dextrose 5% and sodium chloride 0.9% infusion, 125 mL/hr, Intravenous, Continuous, Standley Dakins, MD, Last Rate: 125 mL/hr at 01/28/22 1545, 125 mL/hr at 01/28/22 1545?  diphenhydrAMINE (BENADryl) capsule 25 mg, 25 mg, Oral, q6h PRN, Standley Dakins, MD?  docusate sodium (Colace) capsule 100 mg, 100 mg, Oral, BID PRN, Standley Dakins, MD, 100 mg at 01/29/22 1443?  HYDROmorphone (Dilaudid) injection 0.5 mg, 0.5 mg, Intravenous, Once PRN, Standley Dakins, MD?  ibuprofen tablet 600 mg, 600 mg, Oral, q6h, Standley Dakins, MD, 600 mg at 01/30/22 0630?  ketoROLAC (Toradol) injection 30 mg, 30 mg, Intravenous, q6h PRN, Standley Dakins, MD?  lanolin (Lansinoh) cream, , Topical, PRN, Standley Dakins, MD?  magnesium hydroxide (Milk of Magnesia) suspension 30 mL, 30 mL, Oral, Nightly  PRN, Standley Dakins, MD?  Derrill Memo ON 02/21/2022] naLOXone (Narcan) injection 0.04 mg, 0.04 mg, Intravenous, PRN, Standley Dakins, MD?  ondansetron (Zofran) tablet 4 mg, 4 mg, Oral, q8h PRN **OR** ondansetron (Zofran) injection 4 mg, 4 mg, Intravenous, q8h PRN, Standley Dakins, MD?  oseltamivir (Tamiflu) capsule 75 mg, 75 mg, Oral, BID, [place] Sills, MD, 75 mg at  01/30/22 0949?  oxyCODONE (Roxicodone) immediate release tablet 5 mg, 5 mg, Oral, q6h PRN, Suzan Slick, MD, 5 mg at 01/28/22 1827?  oxytocin (Pitocin) infusion in sodium chloride 0.9% 30 units/500 mL, 500 mL, Intravenous, Once PRN, [place] Abbot, NP?  oxytocin (Pitocin) infusion in sodium chloride 0.9% 30 units/500 mL, 500 mL, Intravenous, Once PRN, [place] Abbot, NP?  pramoxine-hydrocortisone foam, , Topical, PRN, Standley Dakins, MD?  prochlorperazine (Compazine) 5 mg/mL injection 5 mg, 5 mg, Intravenous, q6h PRN, Standley Dakins, MD?  senna (Senokot) tablet 17.2 mg, 2 tablet, Oral, Nightly PRN, Standley Dakins, MD?  simethicone (Mylicon) chewable tablet 40 mg, 40 mg, Oral, 4x daily PRN, Standley Dakins, MD?  Vascular access, , , Once **AND** Saline lock IV, , , Once **AND** sodium chloride flush 0.9 % 1-10 mL, 1-10 mL, Intravenous, PRN, [place] Abbot, NP?  Insert peripheral IV, , , Once **AND** Saline lock IV, , , Once **AND** sodium chloride flush 0.9 % 1-10 mL, 1-10 mL, Intravenous, PRN, [place] Abbot, NP   Contraception plan: No data found   Pain control: Spinal epidural  Birth weight: 3460 g  Current weight: 3264 g  Birth weight change: -6%    Infant risk factors:   Prior breastfeeding experience:  Has mother breastfed before?: Yes  How long did the mother previously breastfeed?: 6 months  Times breastfed in past 24 hours: x 3  Supplemental milk type:Formula 65 mls  Pump availability and type:        Electronically signed by Phylis Bougie, RN at 01/30/2022  1:18 PM EST

## 2022-01-30 NOTE — Unmapped (Signed)
Formatting of this note might be different from the original.  Images from the original note were not included.      [id number]   Informacin sobre la depresin posparto   Usted acaba de tener un beb. Esperaba sentirse emocionada y Biomedical scientist. En cambio, se pone a llorar sin motivo. Quizs le cueste [employer] actividades cotidianas. La mayor parte del tiempo se siente triste, cansada y desesperanzada. Hasta podra sentirse avergonzada o culpable. Pero lo que le est sucediendo no es Therapist, occupational, y puede sentirse mejor. Hable con el proveedor de [employer]. Puede serle de ayuda.     Qu es la depresin?   La depresin es un trastorno del nimo que afecta su manera de pensar y de [place]arry manager. El sntoma ms frecuente es un sentimiento de profunda tristeza. Tambin podra causarle la sensacin de que ya no puede lidiar con la vida. Otros sntomas:     Aumento o prdida excesivos de peso     Dormir demasiado o muy poco     Sensacin de cansancio todo Physiological scientist     Sensacin de intranquilidad     Research officer, trade union mucho     Tener muy poco o demasiado apetito     Alejarse sus amigos y Consulting civil engineer dolores de [place], Scientist, research (life sciences) y otros dolores o problemas estomacales que no se Runner, broadcasting/film/video     Sentirse enojada o furiosa     Tener miedo de querer herir al beb     No tener inters por el beb     Sensacin de inutilidad o culpabilidad     No Multimedia programmer en las cosas que sola gustarle hacer     Dificultades para pensar con claridad o tomar decisiones     Pensamientos sobre la muerte o el suicidio     La depresin despus del parto   Tal vez sienta cansancio y ganas de llorar justo despus de dar a Actuary. Estos sentimientos son normales. A veces se la llama tristeza posparto. Esta etapa melanclica generalmente pasa despus de 1 a 2 semanas. Sin embargo, la depresin posparto (que significa "despus del parto") es mucho ms persistente e intensa que la tristeza posparto. Puede hacerla sentir triste y desesperanzada. Quizs  tenga miedo de que su beb sufra algn dao y le inquiete no ser [employer].   Su pareja tambin puede sufrir depresin posparto. Puede aparecer en el transcurso del ao posterior al nacimiento del nio. Los sntomas son parecidos.     Cules son las causas de la depresin posparto?   La causa exacta de la depresin postparto se desconoce. Se cree que los cambios de la estructura o la qumica del cerebro tienen mucho que ver con la depresin. Puede deberse a los cambios hormonales que suceden durante el parto y despus. Tambin puede deberse al [employer] causan las exigencias del beb y [place] de adaptacin a su maternidad. Todos estos factores podran deprimirla. En algunos casos, existe una predisposicin gentica a este tipo de depresin.     La depresin puede tratarse   Lo bueno es que hay muchas maneras de tratar la depresin postparto. D el primer paso para sentirse mejor hablando con su proveedor de Geophysical data processor.     Cundo llamar al proveedor de atencin mdica   Llame a su proveedor de atencin DIRECTV siguientes casos:      Llora sin un motivo claro     Tiene problemas para dormir, comer y tomar  decisiones     Duda si puede manejar el cuidado de un beb     Tiene sentimientos fuertes de tristeza, ansiedad o desesperacin que le impiden hacer sus tareas diarias     Ms informacin     [facility]), en [email or web address] o al [phone]     [facility]), en [email or web address], en [email or web address] al [place] [facility], en [email or web address] [phone]     Last Reviewed Date: 12/25/2020    2000-2023 [employer] [place]. Todos los derechos reservados. Esta informacin no pretende sustituir la atencin Glass blower/designer. Slo su mdico  puede diagnosticar y tratar un problema de salud.       Electronically signed by Sunny Schlein, RN at 01/30/2022 10:15 AM EST
